# Patient Record
Sex: Female | Born: 1960 | Race: White | Hispanic: No | Marital: Married | Smoking: Never smoker
Health system: Southern US, Community
[De-identification: ages and names within clinical notes are randomized; demographics above are authoritative.]

## PROBLEM LIST (undated history)

## (undated) DIAGNOSIS — E079 Disorder of thyroid, unspecified: Secondary | ICD-10-CM

## (undated) DIAGNOSIS — I7300 Raynaud's syndrome without gangrene: Secondary | ICD-10-CM

## (undated) DIAGNOSIS — D649 Anemia, unspecified: Secondary | ICD-10-CM

## (undated) DIAGNOSIS — R05 Cough: Secondary | ICD-10-CM

## (undated) DIAGNOSIS — K3189 Other diseases of stomach and duodenum: Secondary | ICD-10-CM

## (undated) DIAGNOSIS — R319 Hematuria, unspecified: Secondary | ICD-10-CM

## (undated) DIAGNOSIS — R059 Cough, unspecified: Secondary | ICD-10-CM

## (undated) HISTORY — DX: Hematuria, unspecified: R31.9

## (undated) HISTORY — DX: Anemia, unspecified: D64.9

## (undated) HISTORY — DX: Other diseases of stomach and duodenum: K31.89

## (undated) HISTORY — DX: Raynaud's syndrome without gangrene: I73.00

## (undated) HISTORY — PX: LEEP: SHX91

## (undated) HISTORY — DX: Cough: R05

## (undated) HISTORY — DX: Disorder of thyroid, unspecified: E07.9

## (undated) HISTORY — DX: Cough, unspecified: R05.9

---

## 1998-01-21 ENCOUNTER — Ambulatory Visit (HOSPITAL_COMMUNITY): Admission: RE | Admit: 1998-01-21 | Discharge: 1998-01-21 | Payer: Self-pay | Admitting: Obstetrics and Gynecology

## 1998-09-21 ENCOUNTER — Other Ambulatory Visit: Admission: RE | Admit: 1998-09-21 | Discharge: 1998-09-21 | Payer: Self-pay | Admitting: Obstetrics and Gynecology

## 1998-12-18 ENCOUNTER — Ambulatory Visit (HOSPITAL_COMMUNITY): Admission: RE | Admit: 1998-12-18 | Discharge: 1998-12-18 | Payer: Self-pay | Admitting: Internal Medicine

## 1998-12-18 ENCOUNTER — Encounter: Payer: Self-pay | Admitting: Internal Medicine

## 1999-09-10 ENCOUNTER — Ambulatory Visit (HOSPITAL_COMMUNITY): Admission: RE | Admit: 1999-09-10 | Discharge: 1999-09-10 | Payer: Self-pay | Admitting: Internal Medicine

## 1999-09-10 ENCOUNTER — Encounter: Payer: Self-pay | Admitting: Internal Medicine

## 1999-11-22 ENCOUNTER — Other Ambulatory Visit: Admission: RE | Admit: 1999-11-22 | Discharge: 1999-11-22 | Payer: Self-pay | Admitting: Obstetrics and Gynecology

## 2000-07-31 ENCOUNTER — Encounter: Admission: RE | Admit: 2000-07-31 | Discharge: 2000-07-31 | Payer: Self-pay | Admitting: *Deleted

## 2000-07-31 ENCOUNTER — Encounter: Payer: Self-pay | Admitting: *Deleted

## 2000-12-01 ENCOUNTER — Other Ambulatory Visit: Admission: RE | Admit: 2000-12-01 | Discharge: 2000-12-01 | Payer: Self-pay | Admitting: Obstetrics and Gynecology

## 2001-11-21 ENCOUNTER — Encounter: Payer: Self-pay | Admitting: Family Medicine

## 2001-11-21 ENCOUNTER — Encounter: Admission: RE | Admit: 2001-11-21 | Discharge: 2001-11-21 | Payer: Self-pay | Admitting: Family Medicine

## 2001-12-03 ENCOUNTER — Other Ambulatory Visit: Admission: RE | Admit: 2001-12-03 | Discharge: 2001-12-03 | Payer: Self-pay | Admitting: Obstetrics and Gynecology

## 2001-12-06 ENCOUNTER — Encounter: Payer: Self-pay | Admitting: Obstetrics and Gynecology

## 2001-12-06 ENCOUNTER — Encounter: Admission: RE | Admit: 2001-12-06 | Discharge: 2001-12-06 | Payer: Self-pay | Admitting: Obstetrics and Gynecology

## 2002-09-26 ENCOUNTER — Ambulatory Visit (HOSPITAL_COMMUNITY): Admission: RE | Admit: 2002-09-26 | Discharge: 2002-09-26 | Payer: Self-pay | Admitting: Internal Medicine

## 2002-12-03 ENCOUNTER — Other Ambulatory Visit: Admission: RE | Admit: 2002-12-03 | Discharge: 2002-12-03 | Payer: Self-pay | Admitting: Obstetrics and Gynecology

## 2003-01-06 ENCOUNTER — Encounter: Admission: RE | Admit: 2003-01-06 | Discharge: 2003-01-06 | Payer: Self-pay | Admitting: Obstetrics and Gynecology

## 2003-01-06 ENCOUNTER — Encounter: Payer: Self-pay | Admitting: Obstetrics and Gynecology

## 2003-12-04 ENCOUNTER — Other Ambulatory Visit: Admission: RE | Admit: 2003-12-04 | Discharge: 2003-12-04 | Payer: Self-pay | Admitting: Obstetrics and Gynecology

## 2004-09-01 ENCOUNTER — Encounter: Admission: RE | Admit: 2004-09-01 | Discharge: 2004-09-01 | Payer: Self-pay | Admitting: Family Medicine

## 2004-12-28 ENCOUNTER — Encounter: Admission: RE | Admit: 2004-12-28 | Discharge: 2004-12-28 | Payer: Self-pay | Admitting: Family Medicine

## 2005-01-27 ENCOUNTER — Encounter: Admission: RE | Admit: 2005-01-27 | Discharge: 2005-01-27 | Payer: Self-pay | Admitting: Neurology

## 2005-02-18 ENCOUNTER — Other Ambulatory Visit: Admission: RE | Admit: 2005-02-18 | Discharge: 2005-02-18 | Payer: Self-pay | Admitting: Obstetrics and Gynecology

## 2005-03-18 ENCOUNTER — Encounter: Admission: RE | Admit: 2005-03-18 | Discharge: 2005-03-18 | Payer: Self-pay | Admitting: Obstetrics and Gynecology

## 2005-04-18 ENCOUNTER — Emergency Department (HOSPITAL_COMMUNITY): Admission: EM | Admit: 2005-04-18 | Discharge: 2005-04-18 | Payer: Self-pay | Admitting: Emergency Medicine

## 2006-02-22 ENCOUNTER — Other Ambulatory Visit: Admission: RE | Admit: 2006-02-22 | Discharge: 2006-02-22 | Payer: Self-pay | Admitting: Obstetrics and Gynecology

## 2007-09-04 ENCOUNTER — Other Ambulatory Visit: Admission: RE | Admit: 2007-09-04 | Discharge: 2007-09-04 | Payer: Self-pay | Admitting: Obstetrics and Gynecology

## 2007-09-07 ENCOUNTER — Encounter: Admission: RE | Admit: 2007-09-07 | Discharge: 2007-09-07 | Payer: Self-pay | Admitting: Obstetrics and Gynecology

## 2008-09-12 ENCOUNTER — Other Ambulatory Visit: Admission: RE | Admit: 2008-09-12 | Discharge: 2008-09-12 | Payer: Self-pay | Admitting: Obstetrics and Gynecology

## 2009-09-24 LAB — HM PAP SMEAR

## 2010-05-30 ENCOUNTER — Encounter: Payer: Self-pay | Admitting: Family Medicine

## 2011-03-17 ENCOUNTER — Institutional Professional Consult (permissible substitution): Payer: Self-pay | Admitting: Internal Medicine

## 2011-03-30 ENCOUNTER — Ambulatory Visit: Payer: Self-pay | Admitting: Internal Medicine

## 2011-04-05 ENCOUNTER — Encounter: Payer: Self-pay | Admitting: Internal Medicine

## 2011-04-06 ENCOUNTER — Institutional Professional Consult (permissible substitution): Payer: Self-pay | Admitting: Internal Medicine

## 2011-04-06 ENCOUNTER — Encounter: Payer: Self-pay | Admitting: Internal Medicine

## 2011-04-06 ENCOUNTER — Ambulatory Visit (INDEPENDENT_AMBULATORY_CARE_PROVIDER_SITE_OTHER): Payer: BC Managed Care – PPO | Admitting: Internal Medicine

## 2011-04-06 VITALS — BP 100/62 | HR 62 | Temp 98.3°F | Ht 67.0 in | Wt 126.8 lb

## 2011-04-06 DIAGNOSIS — R059 Cough, unspecified: Secondary | ICD-10-CM | POA: Insufficient documentation

## 2011-04-06 DIAGNOSIS — R918 Other nonspecific abnormal finding of lung field: Secondary | ICD-10-CM

## 2011-04-06 DIAGNOSIS — R05 Cough: Secondary | ICD-10-CM | POA: Insufficient documentation

## 2011-04-06 DIAGNOSIS — R9389 Abnormal findings on diagnostic imaging of other specified body structures: Secondary | ICD-10-CM

## 2011-04-06 NOTE — Progress Notes (Signed)
  Subjective:    Patient ID: Natasha Stafford, female    DOB: [birth year], 49 y.o.   MRN: [id number]  HPI  65 yowf never smoker with no h/o asthma/ allergy to her knowledge with new cough since around 2002 prev w/u in pulm Sherene Sires)  With neg methacholine challenge and ? By ENT with neg response to PPi's though not clear she took them long enough to evaluate response referred back to pulmonary clinic 04/06/11 by Dr Selena Batten for persistent cough and abnormal cxr   04/06/2011  First pulmonary eval in EMR era with cough a bit worse x sev years, mostly dry day > night and  In am after stirring x sev minutes,  What mucus does come up is scant and mucoid with no assoc itching sneezing wheezing overt sinus or HB symtoms.  Sleeping ok without nocturnal  or early am exacerbation  of respiratory  c/o's  Also denies any obvious fluctuation of symptoms with weather or environmental changes or other aggravating or alleviating factors except as outlined above     Review of Systems  Constitutional: Negative for fever and unexpected weight change.  HENT: Negative for ear pain, nosebleeds, congestion, sore throat, rhinorrhea, sneezing, trouble swallowing, dental problem, postnasal drip and sinus pressure.   Eyes: Negative for redness and itching.  Respiratory: Positive for cough. Negative for chest tightness, shortness of breath and wheezing.   Cardiovascular: Positive for palpitations. Negative for leg swelling.  Gastrointestinal: Negative for nausea and vomiting.  Genitourinary: Negative for dysuria.  Musculoskeletal: Negative for joint swelling.  Skin: Negative for rash.  Neurological: Negative for headaches.  Hematological: Does not bruise/bleed easily.  Psychiatric/Behavioral: Negative for dysphoric mood. The patient is not nervous/anxious.        Objective:   Physical Exam  amb wf with peculiar affect who failed to answer a single question asked in a straightforward manner, tending to go off on tangents or  answer questions with ambiguous medical terms or diagnoses and seemed somewhat perplexed when asked the same question more than once for clarification.  freq throat clearing  HEENT: nl dentition, turbinates, and orophanx. Nl external ear canals without cough reflex   NECK :  without JVD/Nodes/TM/ nl carotid upstrokes bilaterally   LUNGS: no acc muscle use, clear to A and P bilaterally without cough on insp or exp maneuvers   CV:  RRR  no s3 or murmur or increase in P2, no edema   ABD:  soft and nontender with nl excursion in the supine position. No bruits or organomegaly, bowel sounds nl  MS:  warm without deformities, calf tenderness, cyanosis or clubbing  SKIN: warm and dry without lesions    NEURO:  alert, approp, no deficits      Assessment & Plan:

## 2011-04-06 NOTE — Patient Instructions (Signed)
Try prilosec 20mg   Take 30-60 min before first meal of the day and Pepcid 20 mg one bedtime until return  GERD (REFLUX)  is an extremely common cause of respiratory symptoms, many times with no significant heartburn at all.    It can be treated with medication, but also with lifestyle changes including avoidance of late meals, excessive alcohol, smoking cessation, and avoid fatty foods, chocolate, peppermint, colas, red wine, and acidic juices such as orange juice.  NO MINT OR MENTHOL PRODUCTS SO NO COUGH DROPS  USE SUGARLESS CANDY INSTEAD (jolley ranchers or Stover's)  NO OIL BASED VITAMINS - use powdered substitutes.    Please schedule a follow up office visit in 6 weeks, call sooner if needed

## 2011-04-07 DIAGNOSIS — R9389 Abnormal findings on diagnostic imaging of other specified body structures: Secondary | ICD-10-CM | POA: Insufficient documentation

## 2011-04-07 NOTE — Assessment & Plan Note (Addendum)
Marking are slt prominent raising the issue of ? Underlying bronchiectasis or MAI - next step in w/u is repeat cxr vs HRCT- placed in tickle file for recall at 6 weeks

## 2011-04-07 NOTE — Assessment & Plan Note (Signed)
The most common causes of chronic cough in immunocompetent adults include the following: upper airway cough syndrome (UACS), previously referred to as postnasal drip syndrome (PNDS), which is caused by variety of rhinosinus conditions; (2) asthma; (3) GERD; (4) chronic bronchitis from cigarette smoking or other inhaled environmental irritants; (5) nonasthmatic eosinophilic bronchitis; and (6) bronchiectasis.   These conditions, singly or in combination, have accounted for up to 94% of the causes of chronic cough in prospective studies.   Other conditions have constituted no >6% of the causes in prospective studies These have included bronchogenic carcinoma, chronic interstitial pneumonia, sarcoidosis, left ventricular failure, ACEI-induced cough, and aspiration from a condition associated with pharyngeal dysfunction.   This has all the features of  Classic Upper airway cough syndrome, so named because it's frequently impossible to sort out how much is  CR/sinusitis with freq throat clearing (which can be related to primary GERD)   vs  causing  secondary (" extra esophageal")  GERD from wide swings in gastric pressure that occur with throat clearing, often  promoting self use of mint and menthol lozenges that reduce the lower esophageal sphincter tone and exacerbate the problem further in a cyclical fashion.   These are the same pts who not infrequently have failed to tolerate ace inhibitors,  dry powder inhalers or biphosphonates or report having reflux symptoms that don't respond to standard doses of PPI , and are easily confused as having aecopd or asthma flares.  The standardized cough guidelines recently published in Chest by [place] in 2006  are a multiple step process (up to 12!) , not a single office visit,  and are intended  to address this problem logically,  with an alogrithm dependent on response to empiric treatment at  each progressive step  to determine a specific diagnosis with   minimal addtional testing needed. Therefore if compliance is an issue or can't be accurately verified then it's very unlikely the standard evaluation and treatment will be successful here.    Furthermore, response to therapy (other than acute cough suppression, which should only be used short term with avoidance of narcotic containing cough syrups if possible), can be a gradual process for which the patient may not receive immediate benefit.  Unlike going to an eye doctor where the right rx is almost always the first one and is immediately effective, this is almost never the case in the management of chronic cough syndromes and the patient needs to commit up front to compliance with recommendations and have the patience to wait out a response for up to 6 weeks of therapy directed at the likely underlying problem(s).    For now rec start the w/u with max acid reflux suppression and diet and f/u here in 6 weeks with next step sinus ct unless seeing ENT actively (this was the same recommendation made on her last evaluation here in 2004 but did not apparently follow through on the recs

## 2011-04-29 ENCOUNTER — Telehealth: Payer: Self-pay | Admitting: Internal Medicine

## 2011-04-29 NOTE — Telephone Encounter (Signed)
Spoke to pt and she states she would like to try zegerid in the place of nexium if this would be ok. She will call back on Monday to see if this is ok with dr wert

## 2011-04-29 NOTE — Telephone Encounter (Signed)
Ok but the dose is Zergerid 40 as the equivalent

## 2011-04-29 NOTE — Telephone Encounter (Signed)
lmomtcb  

## 2011-04-29 NOTE — Telephone Encounter (Signed)
[email or web address].Natasha Stafford, CMA  

## 2011-05-02 NOTE — Telephone Encounter (Signed)
lmomtcb at home #

## 2011-05-04 NOTE — Telephone Encounter (Signed)
Pt aware that she will need to take Zegerid 40mg  daily for equivalent of Nexium. She is going to try the samples of Nexium firtsy and if this is not working she will try Zegerid OTC. Pt aware she will need 40mg  of the Zegerid if she switches to that medication.

## 2011-05-05 ENCOUNTER — Encounter: Payer: Self-pay | Admitting: Internal Medicine

## 2011-05-05 ENCOUNTER — Ambulatory Visit (INDEPENDENT_AMBULATORY_CARE_PROVIDER_SITE_OTHER): Payer: BC Managed Care – PPO | Admitting: Internal Medicine

## 2011-05-05 VITALS — BP 100/60 | HR 79 | Temp 97.5°F | Resp 12 | Ht 67.75 in | Wt 125.0 lb

## 2011-05-05 DIAGNOSIS — N926 Irregular menstruation, unspecified: Secondary | ICD-10-CM

## 2011-05-05 DIAGNOSIS — D649 Anemia, unspecified: Secondary | ICD-10-CM | POA: Insufficient documentation

## 2011-05-05 DIAGNOSIS — E559 Vitamin D deficiency, unspecified: Secondary | ICD-10-CM | POA: Insufficient documentation

## 2011-05-05 DIAGNOSIS — N951 Menopausal and female climacteric states: Secondary | ICD-10-CM

## 2011-05-05 NOTE — Patient Instructions (Signed)
Make a follow up appt with dr. Tresa Res  Continue with primary Md

## 2011-05-05 NOTE — Progress Notes (Signed)
Subjective:    Patient ID: Natasha Stafford, female    DOB: 03/20/1961, 50 y.o.   MRN: [id number]  HPI New pt here for first visit.  She is here to discuss hormone therapy and wishes to keep her primary Md which is Dr. Eduardo Osier Dr. Tresa Res  She is not having vasomotor flushes and has missed her menses in the last 3 months.  She has seen Dr. Tresa Res who wished to place her on Provera withdrawal but pt was hesitant to take this.  She has not had an ultrasound.  No FH or personal history of DVT, PE, blood clotting disorder, uterine or ovarian CA.  No family or personal history of breast cancer.  Paternal GF had heart disease.  She does not report any vaginal dryness or painful intercourse.  Occasionally has trouble sleeping at night.  She has read Rosalita Chessman sommers book and basically wants more information.  She has not had a mammogram in several years      No Known Allergies Past Medical History  Diagnosis Date  . Cough   . Gastric mass     right jugulo   . Emphysema   . Vitamin D deficiency    Past Surgical History  Procedure Date  . Leep    History   Social History  . Marital Status: Married    Spouse Name: N/A    Number of Children: N/A  . Years of Education: N/A   Occupational History  . none    Social History Main Topics  . Smoking status: Never Smoker   . Smokeless tobacco: Never Used  . Alcohol Use: No  . Drug Use: No  . Sexually Active: Yes    Birth Control/ Protection: Condom   Other Topics Concern  . Not on file   Social History Narrative  . No narrative on file   Family History  Problem Relation Age of Onset  . Asthma Brother   . Leukemia Mother    Patient Active Problem List  Diagnoses  . Cough  . Abnormal CXR   Current Outpatient Prescriptions on File Prior to Visit  Medication Sig Dispense Refill  . Ascorbic Acid (VITAMIN C PO) Take 1 tablet by mouth daily.       . Ferrous Sulfate (IRON) 325 (65 FE) MG TABS Take by mouth.        . Multiple  Vitamin (MULTI-VITAMIN DAILY PO) Take by mouth.             Review of Systems    see HPI  No chest pain no SOb no Le edema.  No Gi  Or Gu symptoms Objective:   Physical Exam   Physical Exam  Nursing note and vitals reviewed.  Constitutional: She is oriented to person, place, and time. She appears well-developed and well-nourished.  HENT:  Head: Normocephalic and atraumatic.  Cardiovascular: Normal rate and regular rhythm. Exam reveals no gallop and no friction rub.  No murmur heard.  Pulmonary/Chest: Breath sounds normal. She has no wheezes. She has no rales.  Neurological: She is alert and oriented to person, place, and time.  Skin: Skin is warm and dry.  Psychiatric: She has a normal mood and affect. Her behavior is normal.        Assessment & Plan:  1)  Perimenopause.  Starting to have menstrual irregularities and minimal other symptoms.  Pt given educational handout, encouraged to get her mammogram yearly.  She is still hesitant about this.  I informed  she should never take long term hormones without a baseline mammogram. 2)  Menstrual irregulatrities. I encouraged her to see Dr. Tresa Res and further discuss her recommendations.  She may need an ultrasound at some point but will leave this up to her GYN as she only wishes to discuss hormones with me.  Pt voices that she will call GYN office for appt.  I spent 45 minutes with this pt

## 2011-05-11 ENCOUNTER — Telehealth: Payer: Self-pay | Admitting: *Deleted

## 2011-05-11 NOTE — Telephone Encounter (Signed)
Message copied by Christen Butter on Wed May 11, 2011  5:42 PM ------      Message from: Christen Butter      Created: Tue Apr 12, 2011 11:25 AM       Needs f/u cxr by 06/01/11

## 2011-05-11 NOTE — Telephone Encounter (Signed)
Pt due for ov with cxr by 06/01/11- LMTCB

## 2011-05-19 NOTE — Telephone Encounter (Signed)
Dr. Sherene Sires, I called and spoke with pt and advised needs to come in for rov with month. She verbalized understanding, but states that she has had some GI issues, and never started "program" we put her on- I believe that she is referring to GERD diet and PPI use. She states that she is going to start this next wk and then will call us back in approx 3 wks to schedule her followup. Is this okay? Please advise and if not I will call her back. Thanks!

## 2011-05-19 NOTE — Telephone Encounter (Signed)
That's fine, I put her back in tickle file to call again in 6 weeks

## 2011-07-12 ENCOUNTER — Telehealth: Payer: Self-pay | Admitting: Internal Medicine

## 2011-07-12 NOTE — Telephone Encounter (Signed)
PT informed that no samples of Nexium available at this time.  Pt does not have prescription insurance at this time so she is going to try Prilosec OTC which Dr Sherene Sires had suggested at [facility] in November 2012.  IF this doesn't work she is going to check with pharmacy on price of Nexium Rx and get back with [place].  Pt informed that she is due for ov with cxr with Dr Sherene Sires.  Pt reports that she wants to try the Prilosec first so she weill have something to go by when seeing Dr Sherene Sires.

## 2011-07-13 ENCOUNTER — Telehealth: Payer: Self-pay | Admitting: Internal Medicine

## 2011-07-13 NOTE — Telephone Encounter (Signed)
Set an appt w/ MW w/ CXR on 08/23/11 @ 12:00 p.m.  Pt wanted to let MW now she did not start the "nexium program" until 06/13/11 & then about 1 wk ago she started a different medication.  Pt wanted to be on this for a while prior to her ROV & then will be out on vacation.  Natasha Stafford

## 2011-07-13 NOTE — Telephone Encounter (Signed)
Pt due this month for rov with cxr- nothing scheduled yet so call her to make an appt and had to [facility].

## 2011-08-23 ENCOUNTER — Ambulatory Visit: Payer: BC Managed Care – PPO | Admitting: Internal Medicine

## 2012-07-26 ENCOUNTER — Telehealth: Payer: Self-pay | Admitting: Obstetrics and Gynecology

## 2012-07-26 NOTE — Telephone Encounter (Signed)
Request to speak to nurse about meds

## 2012-07-27 NOTE — Telephone Encounter (Signed)
Left msg to return call to office. sue

## 2012-07-27 NOTE — Telephone Encounter (Signed)
Patient notified of Dr. Tresa Res response after reviewing patient chart. Patient does want to get appt. For HRT consult with Dr. Tresa Res. Transferred call to scheduling.

## 2012-07-27 NOTE — Telephone Encounter (Signed)
Patient called was hesitant to start the prometrium medication as prescribed at last visit due to side effects she had expierienced with other meds. States did take the Prometrium . Last dose done on 2./ 27/2014. Has not had a cycl. Do you want her to continue prometrium? Also patient asking do we do Bio Identical Hormone Replacement here in office?. Please advise. sue

## 2012-07-27 NOTE — Telephone Encounter (Signed)
Natasha Stafford - I will need her paper chart to answer this question.  Thanks.

## 2012-07-27 NOTE — Telephone Encounter (Signed)
She was supposed to take it back in October when I saw her and she hadn't had a period for 6 months.  Now that is has been almost a year, I think it is ok to not take it.  And yes, we do bio-identical HRT here.  If she is interested in discussing HRT, please come in for a consultation.

## 2012-07-27 NOTE — Telephone Encounter (Signed)
Sorry, chart in your cabinet, sue

## 2012-10-10 ENCOUNTER — Telehealth: Payer: Self-pay | Admitting: Obstetrics and Gynecology

## 2012-10-10 NOTE — Telephone Encounter (Signed)
Would like to find out if Dr. Tresa Res Bio Identical [facility] Treatment.

## 2012-10-10 NOTE — Telephone Encounter (Signed)
LMTCB  Route to triage

## 2012-10-12 NOTE — Telephone Encounter (Signed)
Patient interested in Bio-identical testing and treatment of menopausal symptoms and wants to know if CR can do this. Advised that we do not perform this testing and I recommend she have consult visit with CR to discuss her symptoms and the pros and cons of bio-identical testing.  Also advised of CR pending retirement, patient requests to move up aex, (has new insurance) and see Cr one more time and will ask her about this then. AEX sched for 11-23-2012.

## 2012-10-26 ENCOUNTER — Telehealth: Payer: Self-pay | Admitting: Obstetrics and Gynecology

## 2012-10-26 ENCOUNTER — Encounter: Payer: Self-pay | Admitting: Gynecology

## 2012-10-26 ENCOUNTER — Ambulatory Visit (INDEPENDENT_AMBULATORY_CARE_PROVIDER_SITE_OTHER): Payer: BC Managed Care – PPO | Admitting: Gynecology

## 2012-10-26 VITALS — BP 120/76 | HR 64 | Resp 12 | Ht 67.75 in | Wt 126.0 lb

## 2012-10-26 DIAGNOSIS — N898 Other specified noninflammatory disorders of vagina: Secondary | ICD-10-CM

## 2012-10-26 MED ORDER — FLUCONAZOLE 150 MG PO TABS: 150.0000 mg | ORAL_TABLET | Freq: Once | ORAL | Status: DC

## 2012-10-26 NOTE — Telephone Encounter (Signed)
LMTCB about appt  aa 

## 2012-10-26 NOTE — Progress Notes (Signed)
52 y.o.MarriedCaucasian female G2P2 with a 1 month(s) history of the following:discharge described as yellow, thin no odor and vulvar itching Sexually active: yes Last sexual activity:7days ago.  DId not make discharge worse.  Pt also reports the following associated symptoms: none.  LMP 08/2101, no postmenopausal bleeding     Exam:  ZOX:[id number]                WUJ:[id number]: scant, white and thin, pH 4.5, wet prep done, yes                Cx:  normal appearance                Uterus:normal size, anteverted, non-tender                Adnexa: normal adnexa  Wet Prep shows:few yeast   NF:AOZHYQM vaginitis   VH:QION antiviral see orders.

## 2012-10-26 NOTE — Telephone Encounter (Signed)
Pt has a yeast infection and offered appt with dr lathrop at 12:15. Can't come in at that time. Wanting something later in the afternoon.

## 2012-10-26 NOTE — Telephone Encounter (Signed)
Spoke with pt about an appt today. Pt is on a golf [place] with her [place] and is not sure she could make it by 1:15 to see TL. Pt will know in an hour how long she will be and will call back to cancel if she can't make it. Sched OV at 1:15 with TL. Advised on OTC remedies if she cannot make it until Monday.

## 2012-10-29 ENCOUNTER — Ambulatory Visit: Payer: Self-pay | Admitting: Gynecology

## 2012-11-23 ENCOUNTER — Ambulatory Visit: Payer: Self-pay | Admitting: Obstetrics and Gynecology

## 2012-12-05 ENCOUNTER — Institutional Professional Consult (permissible substitution): Payer: Self-pay | Admitting: Obstetrics and Gynecology

## 2012-12-10 ENCOUNTER — Encounter: Payer: Self-pay | Admitting: Obstetrics and Gynecology

## 2012-12-11 ENCOUNTER — Ambulatory Visit (INDEPENDENT_AMBULATORY_CARE_PROVIDER_SITE_OTHER): Payer: BC Managed Care – PPO | Admitting: Obstetrics and Gynecology

## 2012-12-11 ENCOUNTER — Encounter: Payer: Self-pay | Admitting: Obstetrics and Gynecology

## 2012-12-11 VITALS — BP 100/60 | HR 64 | Resp 18 | Ht 67.0 in | Wt 124.0 lb

## 2012-12-11 DIAGNOSIS — Z01419 Encounter for gynecological examination (general) (routine) without abnormal findings: Secondary | ICD-10-CM

## 2012-12-11 DIAGNOSIS — N951 Menopausal and female climacteric states: Secondary | ICD-10-CM

## 2012-12-11 DIAGNOSIS — Z0000 Encounter for general adult medical examination without abnormal findings: Secondary | ICD-10-CM

## 2012-12-11 LAB — POCT URINALYSIS DIPSTICK
Bilirubin, UA: NEGATIVE
Glucose, UA: NEGATIVE
Nitrite, UA: NEGATIVE
Urobilinogen, UA: NEGATIVE

## 2012-12-11 MED ORDER — PROGESTERONE MICRONIZED 100 MG PO CAPS: 100.0000 mg | ORAL_CAPSULE | Freq: Every day | ORAL | Status: DC

## 2012-12-11 MED ORDER — ESTRADIOL 0.05 MG/24HR TD PTTW: 1.0000 | MEDICATED_PATCH | TRANSDERMAL | Status: DC

## 2012-12-11 NOTE — Patient Instructions (Signed)

## 2012-12-11 NOTE — Progress Notes (Addendum)
52 y.o.   Married    Caucasian   female   [phone]   here for annual exam.  Recent vag itching resolved with tx after last visit.  Has been having some darker discharge than usual, but is now getting better.  Never looked brown or bloody.  Pt wishes to consider HRT.  She states she has done a lot of research and wishes to consider the HRT for bone and vulvar health and even thinks she sees little reason not to stay on it for the long term.  Wants "natural" E2 and P4.  Patient's last menstrual period was 09/05/2011.          Sexually active: yes  The current method of family planning is condoms most of the time.    Exercising: no Last mammogram:  09/07/07 neg last mm hurt so much she never went back - will consider - she had intermittant pain for over a year after her last MM Last pap smear: 5/18 11 neg History of abnormal pap: 15-20 years ago (LEEP) Smoking: no Alcohol: occ beer Last colonoscopy:never  ecouraged to go.  Pt will consider.   Last Bone Density: never  Last tetanus shot: 2000 Last cholesterol check: 11/26/10 total 222  Hgb:    11.5            Urine: neg   Family History  Problem Relation Age of Onset  . Asthma Brother   . Leukemia Mother     Patient Active Problem List   Diagnosis Date Noted  . Vitamin d deficiency   . Anemia   . Abnormal CXR 04/07/2011  . Cough 04/06/2011    Past Medical History  Diagnosis Date  . Cough   . 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 Emphysema   . Vitamin D deficiency   . Anemia   . Raynaud disease   . Hematuria     Past Surgical History  Procedure Laterality Date  . Leep      Allergies: Review of patient's allergies indicates no known allergies.  Current Outpatient Prescriptions  Medication Sig Dispense Refill  . Ascorbic Acid (VITAMIN C PO) Take 1 tablet by mouth daily.       Marland Kitchen BIOTIN PO Take by mouth daily.      . Cholecalciferol (VITAMIN D) 2000 UNITS tablet Take 2,000 Units by mouth daily.        .  Ferrous Sulfate (IRON) 325 (65 FE) MG TABS Take by mouth every 30 (thirty) days.       . Multiple Vitamin (MULTI-VITAMIN DAILY PO) Take by mouth once a week.        No current facility-administered medications for this visit.    ROS: Pertinent items are noted in HPI.  Social Hx:  Married, two children, not working  Exam:    BP 100/60  Pulse 64  Resp 18  Ht 5\' 7"  (1.702 m)  Wt 124 lb (56.246 kg)  BMI 19.42 kg/m2  LMP 09/05/2011  Ht and wt stable Wt Readings from Last 3 Encounters:  12/11/12 124 lb (56.246 kg)  10/26/12 126 lb (57.153 kg)  05/05/11 125 lb (56.7 kg)     Ht Readings from Last 3 Encounters:  12/11/12 5\' 7"  (1.702 m)  10/26/12 5' 7.75" (1.721 m)  05/05/11 5' 7.75" (1.721 m)    General appearance: alert, cooperative and appears stated age Head: Normocephalic, without obvious abnormality, atraumatic Neck: no adenopathy, supple, symmetrical, trachea midline  and thyroid not enlarged, symmetric, no tenderness/mass/nodules Lungs: clear to auscultation bilaterally Breasts: Inspection negative, No nipple retraction or dimpling, No nipple discharge or bleeding, No axillary or supraclavicular adenopathy, Normal to palpation without dominant masses Heart: regular rate and rhythm Abdomen: soft, non-tender; bowel sounds normal; no masses,  no organomegaly Extremities: extremities normal, atraumatic, no cyanosis or edema Skin: Skin color, texture, turgor normal. No rashes or lesions Lymph nodes: Cervical, supraclavicular, and axillary nodes normal. No abnormal inguinal nodes palpated Neurologic: Grossly normal   Pelvic: External genitalia:  no lesions              Urethra:  normal appearing urethra with no masses, tenderness or lesions              Bartholins and Skenes: normal                 Vagina: normal appearing vagina with normal color and discharge, no lesions              Cervix: normal appearance              Pap taken: yes        Bimanual Exam:  Uterus:   uterus is normal size, shape, consistency and nontender                                      Adnexa: normal adnexa in size, nontender and no masses                                      Rectovaginal: Confirms                                      Anus:  normal sphincter tone, no lesions  A: normal perimenopausal exam     H/o micoscopic hematuria , none today     Desires HRT     P:     mammogram pap smear counseled on breast self exam, mammography screening, adequate intake of calcium and vitamin D, diet and exercise return annually or prn  Very long discussion with this well informed patient over benefts and risks of HRT, how long to use it, routes of administration.  Questions answered.  She and I decide to have her use Minivelle 0.05 mg and P4 100 mg qhs.  She states she understands that there may be an increased risk of breast/ovarian cancer in users, and she accepts these risks.  I have encouraged her to get a MM now and again qyr while on HRT, and she states she will comply.  She has previously been very reluctant d/t how much and how long she hurt after her last MM.  3D MM recommended.      An After Visit Summary was printed and given to the patient.  12/13/2012  Pap came back ASCUS neg HPV.  CPRomine MD

## 2012-12-13 LAB — IPS PAP TEST WITH HPV

## 2012-12-14 ENCOUNTER — Other Ambulatory Visit: Payer: Self-pay

## 2012-12-14 DIAGNOSIS — Z1231 Encounter for screening mammogram for malignant neoplasm of breast: Secondary | ICD-10-CM

## 2012-12-31 ENCOUNTER — Ambulatory Visit
Admission: RE | Admit: 2012-12-31 | Discharge: 2012-12-31 | Disposition: A | Discharge status: Home / Self Care | Payer: BC Managed Care – PPO | Source: Ambulatory Visit

## 2012-12-31 DIAGNOSIS — Z1231 Encounter for screening mammogram for malignant neoplasm of breast: Secondary | ICD-10-CM

## 2013-01-17 ENCOUNTER — Telehealth: Payer: Self-pay | Admitting: Obstetrics and Gynecology

## 2013-01-17 ENCOUNTER — Other Ambulatory Visit: Payer: Self-pay | Admitting: Obstetrics and Gynecology

## 2013-01-17 DIAGNOSIS — E559 Vitamin D deficiency, unspecified: Secondary | ICD-10-CM

## 2013-01-17 NOTE — Telephone Encounter (Signed)
Patient wants results form her visit on 12-11-2012.

## 2013-01-17 NOTE — Telephone Encounter (Signed)
Spoke with pt about results. Pt thought that a Vit D level was going to be drawn that day as well, as she is on Rx Vit D and has a history of deficiency. Pt a little irritated that there is no Vit D result in Epic, and no mention of it in CR's note from that visit. Would you like pt to come have it drawn?

## 2013-01-17 NOTE — Telephone Encounter (Signed)
I would be happy to reassess the vitamin D level for the patient.  I am sorry that this will require an additional blood draw.  I will put in an order in Epic, and patient will need to schedule this lab visit.

## 2013-01-18 NOTE — Telephone Encounter (Signed)
LMTCB about lab appt for Vit D.  aa

## 2013-01-23 ENCOUNTER — Telehealth: Payer: Self-pay | Admitting: Obstetrics and Gynecology

## 2013-01-23 NOTE — Telephone Encounter (Signed)
Patient has a question regarding her last visit.

## 2013-01-24 NOTE — Telephone Encounter (Signed)
Kennon Rounds,  Thank you for assisting in the care of this patient.

## 2013-01-24 NOTE — Telephone Encounter (Signed)
Follow up call to patient.  She expressed concern over misinformation she received from receptionist and hole time.  In adddition, upset that Vitamin D was not done even though it was discussed and is done each year.  Apologized for her experience and offered to do Vit D level at her convenience but patient declines to come back in. Asurred her these issues are being addressed and she could call back to me, clinical supervisor, for any additional concerns.

## 2013-01-24 NOTE — Telephone Encounter (Signed)
Patient call wanting to speak to you about her last 2 visits.

## 2013-01-25 NOTE — Telephone Encounter (Signed)
LMTCB 01/25/13 cm

## 2013-03-01 ENCOUNTER — Ambulatory Visit: Payer: Self-pay | Admitting: Obstetrics and Gynecology

## 2013-03-11 ENCOUNTER — Telehealth: Payer: Self-pay | Admitting: Obstetrics and Gynecology

## 2013-03-11 NOTE — Telephone Encounter (Signed)
Dr. Edward Jolly,  Previous Patient of Dr. Tresa Res.  Requesting to change to topical progesterone and use Minivelle Patch. Please advise.   Chart to your office.

## 2013-03-11 NOTE — Telephone Encounter (Signed)
Patient has a question about her medications that Dr. Tresa Res prescribed. She was prescribed estrodial and progesterone. Patient would like to know if she can get a prescription for topical prometrium instead of the capsule and keep estrodial the same if possible. Patient has not picked up these medication since she has been stressing about this. Please advice.  Call home number first. vm not set up on cell.

## 2013-03-11 NOTE — Telephone Encounter (Signed)
French Ana,  This will need to come from a compounding pharmacy such as [facility] on [address] Can you contact them to get the suggested dosage for this so I can prescribe it?  Thanks!

## 2013-03-11 NOTE — Telephone Encounter (Signed)
Spoke with patient. I advised I would call [facility] to find out dosages and cost for patient on 11/4.

## 2013-03-11 NOTE — Telephone Encounter (Signed)
Natasha Stafford,  For clarification, the progesterone cream is what will need to be compounded. The Minivelle, of course, comes in standard dosages and is not compounded.  Thanks.

## 2013-03-12 NOTE — Telephone Encounter (Signed)
Dr. Edward Jolly, I spoke with [facility]:   They state:   Progesterone can be ordered based on the dosage that patient would need. So if you wanted patient to stay on 100 mg Progesterone daily, Will need to order:   10% Progesterone (100 mg/ml) in HRT Base. Apply 1 ml topically daily. Dispense 90 grams.   90 grams will last 90 days, 30 grams to last 30 days.   Cost for patient will be 37.79/30 days     73.28/90 days  Spoke with patient, she would like a 30 day supply.   I pended the rx for your review and signature.

## 2013-03-13 MED ORDER — NONFORMULARY OR COMPOUNDED ITEM: Status: DC

## 2013-03-22 ENCOUNTER — Telehealth: Payer: Self-pay | Admitting: Obstetrics and Gynecology

## 2013-03-22 NOTE — Telephone Encounter (Signed)
Return call to patient, LMTCB.  

## 2013-03-22 NOTE — Telephone Encounter (Signed)
Patient had mammogram in august and got a letter stating she has dense breast tissue. She didn't know if she needed to have breast ultrasound.

## 2013-03-25 NOTE — Telephone Encounter (Signed)
Patient is calling because she got a letter for [facility] imaging with notation that she had dense breasts. Patient states her last mammogram in August was 3D as reccommended by Dr. Tresa Res.  Patient is holding off on starting hormone therapy because she is worried that the past exam was not sufficient enough to view all of the breast tissue.  Patient is wondering if she can have breast U/S.  I advised that since she had a normal screening mammogram that U/S would not be necessary and 3D should be sufficient.  I advised I would send message to Dr. Edward Jolly to see if she concurs. Patient denies any breast issue, has no complaints at this time.

## 2013-03-25 NOTE — Telephone Encounter (Signed)
I have reviewed patient's 3D mammogram which was read as normal.   There were no suspicious areas requiring any further evaluation.  She will need her regular screening mammogram next year. Random breast ultrasound is not indicated at this time unless there is concern about a specific lump. I do recommend that the patient perform monthly self breast exams. If patient would like to have a visit with me to discuss hormone therapy, I am very happy to review this with her!

## 2013-03-26 NOTE — Telephone Encounter (Signed)
Spoke with patient and message from Dr. Edward Jolly given. Patient verbalized understanding and was happy with Dr. Rica Records response. She will call for any f/u needed prn.

## 2013-09-11 ENCOUNTER — Telehealth: Payer: Self-pay | Admitting: Obstetrics and Gynecology

## 2013-09-11 NOTE — Telephone Encounter (Signed)
Spoke with patient. Advised last tetanus shot was given 09/12/2008 and last cholesterol level that was drawn was in 2012. Patient agreeable and verbalizes understanding.  Routing to provider for final review. Patient agreeable to disposition. Will close encounter.

## 2013-09-11 NOTE — Telephone Encounter (Signed)
Patient wants to know when her last tetnus shot was and when she last had her cholesterol checked

## 2013-09-25 ENCOUNTER — Telehealth: Payer: Self-pay | Admitting: Obstetrics and Gynecology

## 2013-09-25 NOTE — Telephone Encounter (Signed)
Pt states she had her blood drawn last visit and wants to know what was actually was checked. She also wants to know if she needs to have her cholestenol checked now.

## 2013-09-26 NOTE — Telephone Encounter (Signed)
Spoke with patient. She was given information regarding her last lab testing, August of 12/2012, had hemoglobin drawn only. Patient states she feels like more labs were drawn then that. Advised that I can only see results from hgb and apologized for any confusion. Patient is seeing her pcp and wanted to know what labs last tested so as to not repeat. Advised per Dr. Harlene Saltsomine's note last cholesterol was 2012. Patient wanted to know if she would be covered for a routine exam with pcp and advised that she would need to contact her insurance company to verify. Advised that last visit was routine gyn exam. Patient verbalized understanding and will follow up prn.   Routing to provider for final review. Patient agreeable to disposition. Will close encounter

## 2013-12-05 ENCOUNTER — Telehealth: Payer: Self-pay | Admitting: Obstetrics and Gynecology

## 2013-12-05 NOTE — Telephone Encounter (Signed)
Confirming pts appt °

## 2013-12-12 ENCOUNTER — Encounter: Payer: Self-pay | Admitting: Obstetrics and Gynecology

## 2013-12-12 ENCOUNTER — Ambulatory Visit (INDEPENDENT_AMBULATORY_CARE_PROVIDER_SITE_OTHER): Payer: BC Managed Care – PPO | Admitting: Obstetrics and Gynecology

## 2013-12-12 VITALS — BP 108/72 | HR 68 | Ht 67.0 in | Wt 121.0 lb

## 2013-12-12 DIAGNOSIS — Z0000 Encounter for general adult medical examination without abnormal findings: Secondary | ICD-10-CM

## 2013-12-12 DIAGNOSIS — Z01419 Encounter for gynecological examination (general) (routine) without abnormal findings: Secondary | ICD-10-CM

## 2013-12-12 LAB — POCT URINALYSIS DIPSTICK
BILIRUBIN UA: NEGATIVE
Glucose, UA: NEGATIVE
KETONES UA: NEGATIVE
Leukocytes, UA: NEGATIVE
NITRITE UA: NEGATIVE
PH UA: 6
PROTEIN UA: NEGATIVE
Urobilinogen, UA: NEGATIVE

## 2013-12-12 LAB — HEMOGLOBIN, FINGERSTICK: Hemoglobin, fingerstick: 12.2 g/dL (ref 12.0–16.0)

## 2013-12-12 NOTE — Patient Instructions (Signed)

## 2013-12-12 NOTE — Progress Notes (Signed)
GYNECOLOGY VISIT  PCP:  Dr. Pearson GrippeJames Kim  Referring provider:   HPI: 53 y.o.   Married  Caucasian  female   [phone] with Patient's last menstrual period was 09/05/2011.   here for   Annual.  Multiple questions regarding the rationale of screening testing, benefits and risks of HRT and anemia.  No labs available for review today but has questions about them.  Does not want to duplicated labs. Asking about ultrasound for breast cancer screening.   Has a history of low hemoglobin and wants iron checked.   Had labs with Dr. Selena BattenKim and was told it was normal.  Vitamin D was normal.  Taking Vit D and calcium.   Stopped HRT from Dr. Tresa Resomine.  She was having hair loss prior to this.  (Had thyroid function testing with her PCP. ) Micah FlesherWent to another provider to have bioidentical HRT and to measure levels and follow them.  Using Progesterone cream. Just stopped using Minivelle as her estrogen level was high.   Hgb:  12.2 Urine:  Trace blood. Asymptomatic. Longstanding.  Saw Dr. Aldean AstKimbrough and had evaluation and cystoscopy was negative.     GYNECOLOGIC HISTORY: Patient's last menstrual period was 09/05/2011. Sexually active:  yes Partner preference: female Contraception:   none Menopausal hormone therapy: progesterone Cream DES exposure:   no Blood transfusions: no    Sexually transmitted diseases: no GYN procedures and prior surgeries:  LEEP Last mammogram:      01/02/2013 Bi-Rads Neg           Last pap and high risk HPV testing:  12/11/12 ASCUS, negative HR HPV.  History of abnormal pap smear:  Yes 15-20 years ago (LEEP)   OB History   Grav Para Term Preterm Abortions TAB SAB Ect Mult Living   5 2   3 3    2        LIFESTYLE: Exercise:  no             Tobacco: no Alcohol:no Drug use:no    OTHER HEALTH MAINTENANCE: Tetanus/TDap:09/2008 Gardisil:no Influenza: no   Zostavax:no  Bone density: 09/2013 WNL pet pt Colonoscopy:10/23/13, WNL per pt  Cholesterol check: PCP  Family History   Problem Relation Age of Onset  . Asthma Brother   . Leukemia Mother     Patient Active Problem List   Diagnosis Date Noted  . Vitamin d deficiency   . Anemia   . Abnormal CXR 04/07/2011  . Cough 04/06/2011   Past Medical History  Diagnosis Date  . Cough   . 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 Emphysema   . Vitamin D deficiency   . Anemia   . Raynaud disease   . Hematuria     Past Surgical History  Procedure Laterality Date  . Leep      ALLERGIES: Review of patient's allergies indicates no known allergies.  Current Outpatient Prescriptions  Medication Sig Dispense Refill  . Ascorbic Acid (VITAMIN C PO) Take 1 tablet by mouth daily.       . Cholecalciferol (VITAMIN D) 2000 UNITS tablet Take 2,000 Units by mouth daily.        . Multiple Vitamin (MULTI-VITAMIN DAILY PO) Take by mouth once a week.       . NONFORMULARY OR COMPOUNDED ITEM 2.5 mLs daily. 10% (100 mg/ml) Progesterone Cream in HRT Base. Dispense 30 grams for 30 day supply.      Marland Kitchen. BIOTIN PO Take by mouth daily.      .Marland Kitchen  estradiol (MINIVELLE) 0.05 MG/24HR patch Place 1 patch (0.05 mg total) onto the skin 2 (two) times a week. Apply anywhere on lower abdomen.  Change patch on Sun am and Wed pm  8 patch  12  . Ferrous Sulfate (IRON) 325 (65 FE) MG TABS Take by mouth every 30 (thirty) days.        No current facility-administered medications for this visit.     ROS:  Pertinent items are noted in HPI.  SOCIAL HISTORY:  ---  PHYSICAL EXAMINATION:    BP 108/72  Pulse 68  Ht 5\' 7"  (1.702 m)  Wt 121 lb (54.885 kg)  BMI 18.95 kg/m2  LMP 09/05/2011   Wt Readings from Last 3 Encounters:  12/12/13 121 lb (54.885 kg)  12/11/12 124 lb (56.246 kg)  10/26/12 126 lb (57.153 kg)     Ht Readings from Last 3 Encounters:  12/12/13 5\' 7"  (1.702 m)  12/11/12 5\' 7"  (1.702 m)  10/26/12 5' 7.75" (1.721 m)    General appearance: alert, cooperative and appears stated age Head: Normocephalic, without  obvious abnormality, atraumatic Neck: no adenopathy, supple, symmetrical, trachea midline and thyroid not enlarged, symmetric, no tenderness/mass/nodules Lungs: clear to auscultation bilaterally Breasts: Inspection negative, No nipple retraction or dimpling, No nipple discharge or bleeding, No axillary or supraclavicular adenopathy, Normal to palpation without dominant masses Heart: regular rate and rhythm Abdomen: soft, non-tender; no masses,  no organomegaly Extremities: extremities normal, atraumatic, no cyanosis or edema Skin: Skin color, texture, turgor normal. No rashes or lesions Lymph nodes: Cervical, supraclavicular, and axillary nodes normal. No abnormal inguinal nodes palpated Neurologic: Grossly normal  Pelvic: External genitalia:  no lesions              Urethra:  normal appearing urethra with no masses, tenderness or lesions              Bartholins and Skenes: normal                 Vagina: normal appearing vagina with normal color and discharge, no lesions              Cervix: normal appearance              Pap and high risk HPV testing done: Yes.           Bimanual Exam:  Uterus:  uterus is normal size, shape, consistency and nontender                                      Adnexa: normal adnexa in size, nontender and no masses                                      Rectovaginal: Confirms                                      Anus:  normal sphincter tone, no lesions  ASSESSMENT  Normal gynecologic exam. Dense breast tissue.  History of LEEP.  HRT patient.   PLAN  Check Hgb now:  12.2 Mammogram recommended yearly.  May want to add thermography.  Discussed limits of mammogram with dense breast tissue.  Pap smear and high risk HPV testing performed.  Discussed risks and benefits of  HRT.  Patient may choose to continue through her other provider doing bioidentical.  Counseled on self breast exam, Calcium and vitamin D intake, exercise. Return annually or prn   An After  Visit Summary was printed and given to the patient.  25 additional minutes discussing HRT, breast cancer screening, pap smears, blood testing for anemia.

## 2013-12-18 LAB — IPS PAP TEST WITH HPV

## 2013-12-23 ENCOUNTER — Telehealth: Payer: Self-pay

## 2013-12-23 NOTE — Telephone Encounter (Signed)
Called pt with pap smear results and LMOVM [phone] to call me.

## 2013-12-23 NOTE — Telephone Encounter (Signed)
Message copied by Alphonsa OverallIXON, Trenton Passow L on Mon Dec 23, 2013  9:29 AM ------      Message from: AMUNDSON DE Gwenevere GhaziARVALHO E SILVA, BROOK E      Created: Thu Dec 19, 2013 10:08 AM       Please inform patient of ASCUS pap, atypical cells of undetermined significance.       High risk HPV status is negative.       Next pap in three years.      Enter recall - 02 for annual exam. ------

## 2013-12-25 NOTE — Telephone Encounter (Signed)
Pt is calling amanda back. °

## 2013-12-26 ENCOUNTER — Telehealth: Payer: Self-pay

## 2013-12-26 NOTE — Telephone Encounter (Signed)
Patient notified see result note 

## 2013-12-26 NOTE — Telephone Encounter (Signed)
Message copied by Alphonsa OverallIXON, AMANDA L on Thu Dec 26, 2013  1:18 PM ------      Message from: AMUNDSON DE Gwenevere GhaziARVALHO E SILVA, BROOK E      Created: Thu Dec 19, 2013 10:08 AM       Please inform patient of ASCUS pap, atypical cells of undetermined significance.       High risk HPV status is negative.       Next pap in three years.      Enter recall - 02 for annual exam. ------

## 2013-12-26 NOTE — Telephone Encounter (Signed)
Called pt with pap smear results at [phone] and LMOVM to call me.

## 2014-03-10 ENCOUNTER — Encounter: Payer: Self-pay | Admitting: Obstetrics and Gynecology

## 2014-03-28 ENCOUNTER — Telehealth: Payer: Self-pay | Admitting: Obstetrics and Gynecology

## 2014-03-28 NOTE — Telephone Encounter (Signed)
Left message regarding upcoming appointment time change. °

## 2014-09-10 ENCOUNTER — Other Ambulatory Visit: Payer: Self-pay | Admitting: Family Medicine

## 2014-09-10 DIAGNOSIS — E041 Nontoxic single thyroid nodule: Secondary | ICD-10-CM

## 2014-09-11 ENCOUNTER — Ambulatory Visit
Admission: RE | Admit: 2014-09-11 | Discharge: 2014-09-11 | Disposition: A | Discharge status: Home / Self Care | Payer: BLUE CROSS/BLUE SHIELD | Source: Ambulatory Visit | Attending: Family Medicine | Admitting: Family Medicine

## 2014-09-11 DIAGNOSIS — E041 Nontoxic single thyroid nodule: Secondary | ICD-10-CM

## 2014-12-12 ENCOUNTER — Telehealth: Payer: Self-pay | Admitting: Obstetrics and Gynecology

## 2014-12-12 NOTE — Telephone Encounter (Signed)
Patient has an appointment next week for her aex with Dr.Silva. Patient has some questions for her nurse before coming to this appointment. Last seen 12/12/13.

## 2014-12-12 NOTE — Telephone Encounter (Signed)
Spoke with patient. Patient has aex scheduled for 12/19/2014 at 4pm with Dr.Silva. Patient is asking if she will be due for a pap smear at this appointment. Advised per note from Dr.Silva from last years pap next pap will be in 2018. Please see note below. Patient is agreeable and verbalizes understanding. Aware of important of keeping her aex for pelvic and breast exam and to have any medications refilled if needed.  Alphonsa Overall, CMA at 12/26/2013 1:18 PM     Status: Signed       Expand All Collapse All   Message copied by Alphonsa Overall on Thu Dec 26, 2013 1:18 PM ------  Message from: AMUNDSON DE Gwenevere Ghazi, BROOK E  Created: Thu Dec 19, 2013 10:08 AM   Please inform patient of ASCUS pap, atypical cells of undetermined significance.   High risk HPV status is negative.   Next pap in three years.  Enter recall - 02 for annual exam.       Routing to provider for final review. Patient agreeable to disposition. Will close encounter.   Patient aware provider will review message and nurse will return call if any additional advice or change of disposition.

## 2014-12-19 ENCOUNTER — Ambulatory Visit: Payer: BC Managed Care – PPO | Admitting: Obstetrics and Gynecology

## 2015-01-23 ENCOUNTER — Other Ambulatory Visit: Payer: Self-pay

## 2015-01-26 ENCOUNTER — Other Ambulatory Visit: Payer: Self-pay | Admitting: Family Medicine

## 2015-01-26 DIAGNOSIS — E042 Nontoxic multinodular goiter: Secondary | ICD-10-CM

## 2015-01-28 ENCOUNTER — Ambulatory Visit
Admission: RE | Admit: 2015-01-28 | Discharge: 2015-01-28 | Disposition: A | Discharge status: Home / Self Care | Payer: 59 | Source: Ambulatory Visit | Attending: Family Medicine | Admitting: Family Medicine

## 2015-01-28 DIAGNOSIS — E042 Nontoxic multinodular goiter: Secondary | ICD-10-CM

## 2015-02-27 ENCOUNTER — Ambulatory Visit: Payer: Self-pay | Admitting: Obstetrics and Gynecology

## 2015-03-06 ENCOUNTER — Encounter: Payer: Self-pay | Admitting: Obstetrics and Gynecology

## 2015-03-06 ENCOUNTER — Ambulatory Visit (INDEPENDENT_AMBULATORY_CARE_PROVIDER_SITE_OTHER): Payer: 59 | Admitting: Obstetrics and Gynecology

## 2015-03-06 DIAGNOSIS — Z0000 Encounter for general adult medical examination without abnormal findings: Secondary | ICD-10-CM | POA: Diagnosis not present

## 2015-03-06 DIAGNOSIS — Z79890 Hormone replacement therapy (postmenopausal): Secondary | ICD-10-CM

## 2015-03-06 DIAGNOSIS — N852 Hypertrophy of uterus: Secondary | ICD-10-CM

## 2015-03-06 DIAGNOSIS — Z01419 Encounter for gynecological examination (general) (routine) without abnormal findings: Secondary | ICD-10-CM | POA: Diagnosis not present

## 2015-03-06 NOTE — Patient Instructions (Signed)

## 2015-03-06 NOTE — Progress Notes (Signed)
Patient ID: Natasha Stafford, female   DOB: Dec 07, 1960, 54 y.o.   MRN: [id number] 54 y.o. [id number] Married Caucasian female here for annual exam.    Patient is on estriol and progesterone through provider in [employer].   Does labs with provider in [place].  PCP: Pearson Grippe, MD.    Patient's last menstrual period was 09/05/2011.          Sexually active: Yes.   female The current method of family planning is post menopausal status.    Exercising: Yes.    Walking. Smoker:  no  Health Maintenance: Pap:  12-12-13 Ascus:Neg HR HPV History of abnormal Pap:  Yes, Hx LEEP procedure around 1995. MMG:  01-02-13 Density Cat.C/Neg/BiRads1:[facility].  Has had a painful mammogram in the past which was long lasting.  Colonoscopy:  10-23-13 normal per patient BMD:   09/2013  Result  Normal per patient TDaP:  09/2008 Screening Labs:  Hb today: 11.5, Urine today: Neg   reports that she has never smoked. She has never used smokeless tobacco. She reports that she drinks about 0.5 oz of alcohol per week. She reports that she uses illicit drugs.  Past Medical History  Diagnosis Date  . Cough   . 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 Emphysema   . Vitamin D deficiency   . Anemia   . Raynaud disease   . Hematuria   . Thyroid disease     3 thyroid nodules--seeing Dr. Tonette Lederer in [place]    Past Surgical History  Procedure Laterality Date  . Leep      --1995    Current Outpatient Prescriptions  Medication Sig Dispense Refill  . Ascorbic Acid (VITAMIN C PO) Take 1 tablet by mouth daily.     Marland Kitchen BIOTIN PO Take by mouth daily.    . Cholecalciferol (VITAMIN D3) 5000 UNITS CAPS Take 1 capsule by mouth every other day.    . Ferrous Sulfate (IRON) 325 (65 FE) MG TABS Take by mouth every 30 (thirty) days.     . Multiple Vitamin (MULTI-VITAMIN DAILY PO) Take by mouth once a week.     . NONFORMULARY OR COMPOUNDED ITEM 2.5 mLs daily. 10% (100 mg/ml) Progesterone Cream in HRT Base.  Dispense 30 grams for 30 day supply.    Marland Kitchen PRESCRIPTION MEDICATION Estriol Versabase 0.1mg /ml Cream .3 syringe full pv 3x/week     No current facility-administered medications for this visit.    Family History  Problem Relation Age of Onset  . Asthma Brother   . Leukemia Mother     ROS:  Pertinent items are noted in HPI.  Otherwise, a comprehensive ROS was negative.  Exam:   LMP 09/05/2011    General appearance: alert, cooperative and appears stated age Head: Normocephalic, without obvious abnormality, atraumatic Neck: no adenopathy, supple, symmetrical, trachea midline and thyroid normal to inspection and palpation Lungs: clear to auscultation bilaterally Breasts: normal appearance, no masses or tenderness, Inspection negative, No nipple retraction or dimpling, No nipple discharge or bleeding, No axillary or supraclavicular adenopathy Heart: regular rate and rhythm Abdomen: soft, non-tender; bowel sounds normal; no masses,  no organomegaly Extremities: extremities normal, atraumatic, no cyanosis or edema Skin: Skin color, texture, turgor normal. No rashes or lesions Lymph nodes: Cervical, supraclavicular, and axillary nodes normal. No abnormal inguinal nodes palpated Neurologic: Grossly normal  Pelvic: External genitalia:  no lesions              Urethra:  normal appearing urethra with no masses, tenderness or lesions              Bartholins and Skenes: normal                 Vagina: normal appearing vagina with normal color and discharge, no lesions              Cervix: no lesions              Pap taken: Yes.   Bimanual Exam:  Uterus:  slightly enlarged irregular texture of uterus consistent with probable small fibroids.              Adnexa: normal adnexa and no mass, fullness, tenderness              Rectovaginal: Yes.  .  Confirms.              Anus:  normal sphincter tone, no lesions  Chaperone was present for exam.  Assessment:   Well woman visit with normal exam. Hx  LEEP in 1995.  ASCUS paps and negative HR HPV. Enlarged uterus consistent with probable small fibroids discussed with patient.  HRT may be stimulating these. On HRT.  Plan: Yearly mammogram recommended after age 54. I told patient I recommend this yearly, especially due to her taking HRT.  Recommended self breast exam.  Pap and HR HPV as above. Discussed Calcium, Vitamin D, regular exercise program including cardiovascular and weight bearing exercise. Labs performed.  Yes.  .   Hgb 11.5 Refills given on medications.  No..    Return for pelvic ultrasound.  Order placed.  Follow up annually and prn.     After visit summary provided.

## 2015-03-10 LAB — IPS PAP TEST WITH HPV

## 2015-03-11 LAB — HEMOGLOBIN, FINGERSTICK: HEMOGLOBIN, FINGERSTICK: 11.5 g/dL — AB (ref 12.0–16.0)

## 2015-03-19 ENCOUNTER — Ambulatory Visit (INDEPENDENT_AMBULATORY_CARE_PROVIDER_SITE_OTHER): Payer: 59

## 2015-03-19 ENCOUNTER — Encounter: Payer: Self-pay | Admitting: Obstetrics and Gynecology

## 2015-03-19 ENCOUNTER — Ambulatory Visit (INDEPENDENT_AMBULATORY_CARE_PROVIDER_SITE_OTHER): Payer: 59 | Admitting: Obstetrics and Gynecology

## 2015-03-19 VITALS — BP 102/60 | HR 80 | Ht 67.0 in | Wt 125.0 lb

## 2015-03-19 DIAGNOSIS — N852 Hypertrophy of uterus: Secondary | ICD-10-CM

## 2015-03-19 DIAGNOSIS — Z79890 Hormone replacement therapy (postmenopausal): Secondary | ICD-10-CM

## 2015-03-19 NOTE — Progress Notes (Signed)
Subjective  54 y.o. [id number]  Caucasian female here for pelvic ultrasound for enlarged uterus on pelvic exam.  Patient is on bioidentical HRT. Patient is due for another hormone check.  Does this yearly.  Asking for input on this.  Brings in labs today from her prescriber. Sleep is improved on progesterone cream.  Having hair loss.   Has thyroid nodules. Sees Dr. Kalman ShanAugustides in [facility] for this.  She thinks thyroid testing was normal.  Getting confused about HRT.  Seeing a lot of different doctors - two of which do bioidenticals and want to be measuring her levels either with saliva or blood.  Getting expensive.  Objective  Pelvic ultrasound images and report reviewed with patient.  Uterus - no masses, retroverted uterus EMS - 2.85 mm Ovaries - normal. Free fluid - yes, small amount.      Assessment  Enlarged uterus on exam.  Normal pelvic ultrasound.  Retroverted uterus.    HRT.  Plan Reassurance given regarding pelvic anatomy. Extensive discussion regarding HRT, bioidentical versus nonbioidentical HRT, risks and benefits.  I told patient I can prescribe for her HRT - estradiol and Prometrium but that she must be up to date with annual mammogram which would need to be normal.  Handout on menopause.  Follow up prn.  ___25____ minutes face to face time of which over 50% was spent in counseling.   After visit summary to patient.

## 2015-03-23 ENCOUNTER — Telehealth: Payer: Self-pay | Admitting: Obstetrics and Gynecology

## 2015-03-23 NOTE — Telephone Encounter (Signed)
Patient has some questions about her AVS for 03/19/15. She states it is not complete and some medications is incorrect.

## 2015-03-23 NOTE — Telephone Encounter (Signed)
Spoke with patient. Patient has concerns about her primary diagnosis being "enlarged uterus" on her AVS. "When I had the ultrasound we found out that my uterus is a normal size and not enlarged. I just want to make sure this is not considered a diagnosis when it is was normal." Advised this was used as the primary diagnosis as this was the indication for her having the PUS evaluation with our office. Patient would like to know if this diagnosis will continue to be in her chart for each visit. Advised if findings were not indicative of an enlarged uterus it should not. Patient is agreeable. Patient also states that she is not taking Iron 325 mg every 30 days. Is taking Iron 18 mg three times per week. Discontinued Iron 325 mg and added Iron 18 mg po three times a week as a historical med. Patient is agreeable. Advised I will send a message to Dr.Silva and if she has any additional information I will return the call.

## 2015-03-23 NOTE — Telephone Encounter (Signed)
I agree with the information you shared with the patient.  We did need to "enlarged uterus" to link to the reason for asking for the pelvic ultrasound and then her visit.  Her office note documents that her ultrasound was normal.

## 2016-03-10 ENCOUNTER — Ambulatory Visit: Payer: 59 | Admitting: Obstetrics and Gynecology

## 2016-03-17 ENCOUNTER — Ambulatory Visit: Payer: 59 | Admitting: Obstetrics and Gynecology

## 2016-03-22 IMAGING — US US SOFT TISSUE HEAD/NECK
1 series · 14 of 25 positions shown · non-contrast
Comparison: 09/11/2014

CLINICAL DATA: Nodule.

EXAM:
THYROID ULTRASOUND
TECHNIQUE: Ultrasound examination of the thyroid gland and adjacent soft
tissues was performed.

[Series 1: us soft tissue head/neck · 0.06mm/px · 14 of 36 slices shown]
[im 1/36]
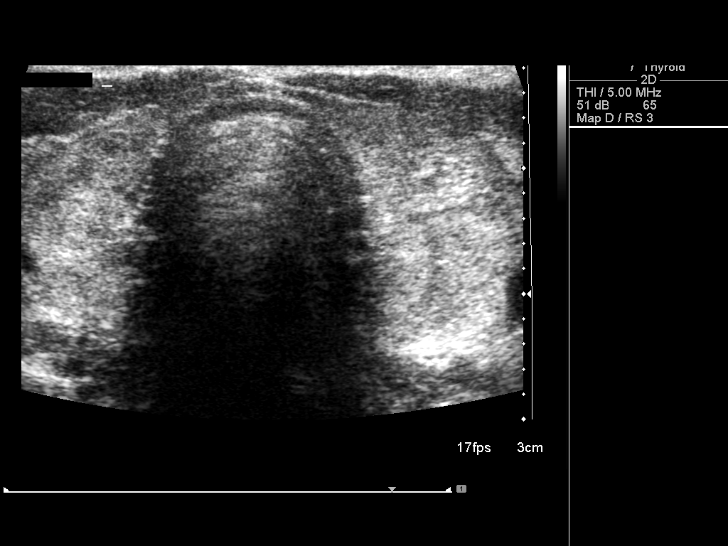
[im 3/36]
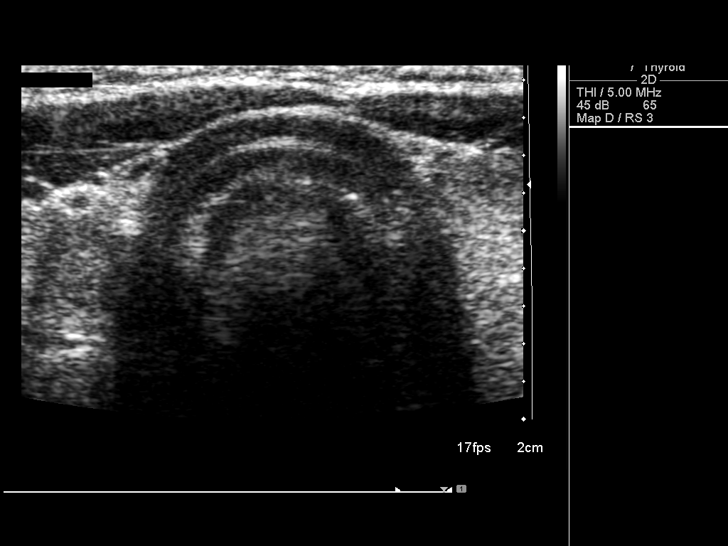
[im 6/36]
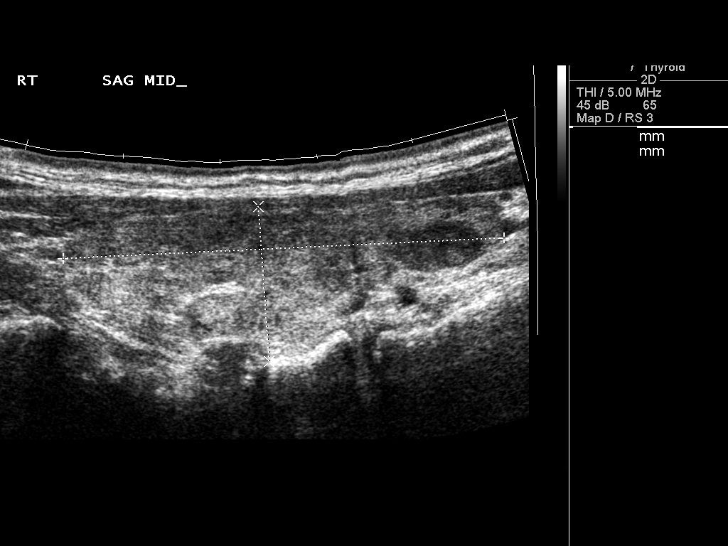
[im 9/36]
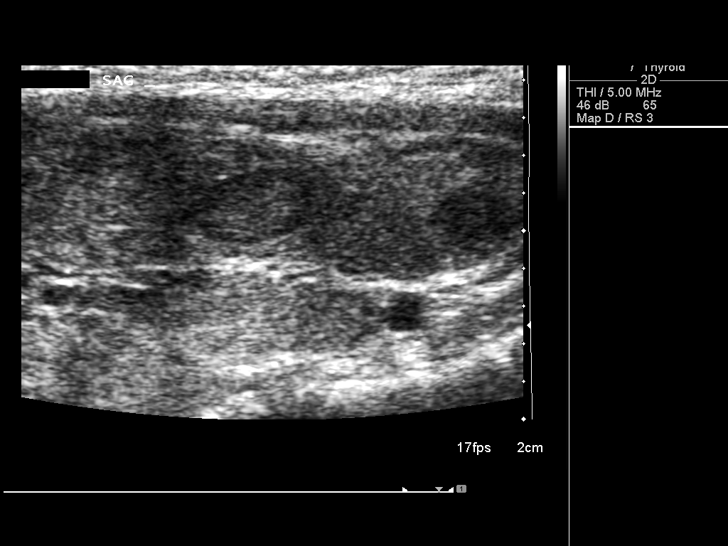
[im 12/36]
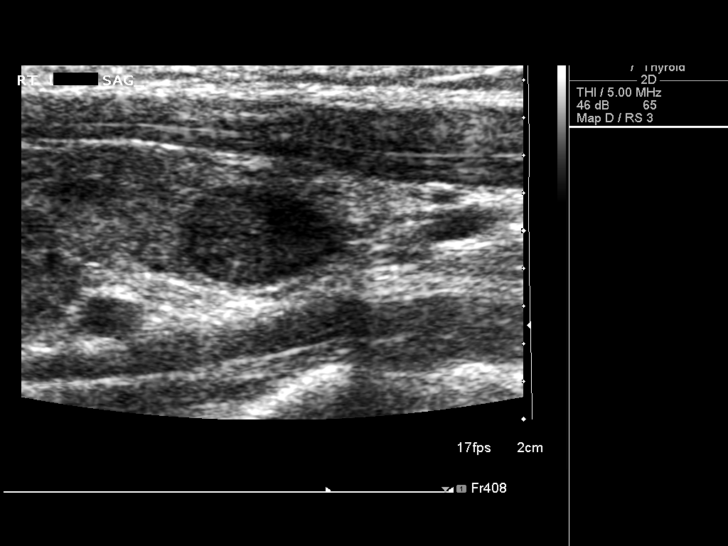
[im 14/36]
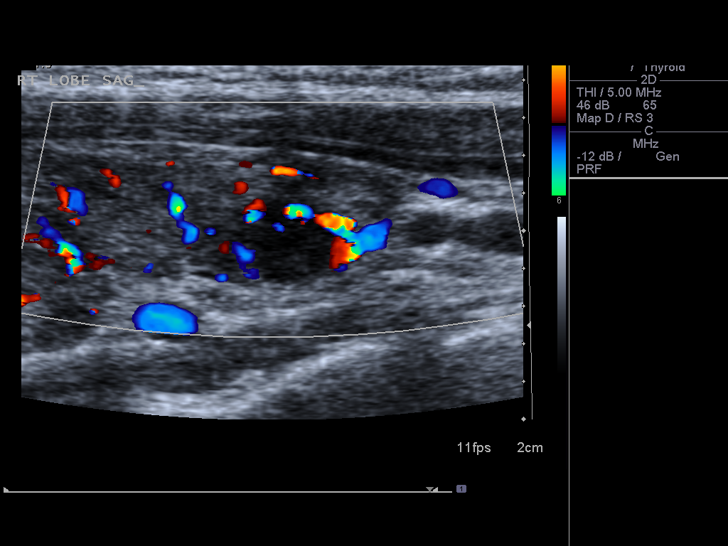
[im 17/36]
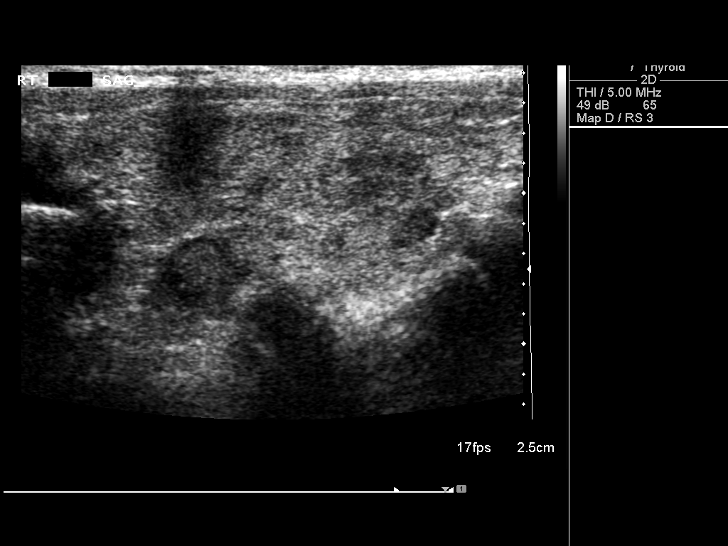
[im 19/36]
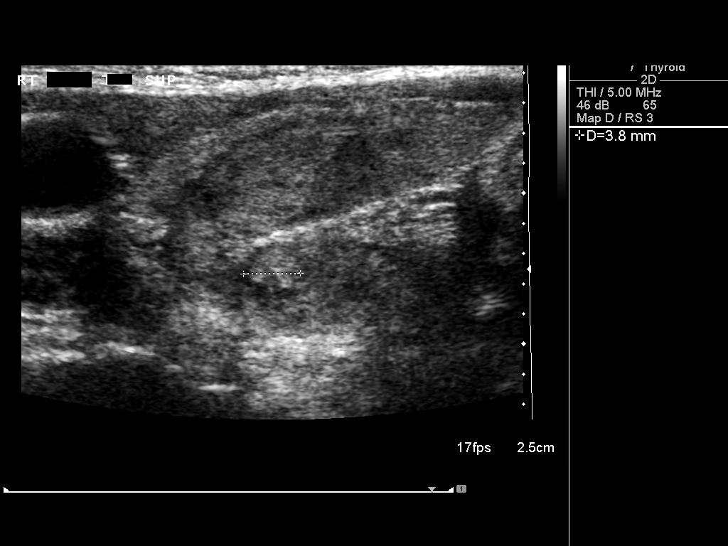
[im 22/36]
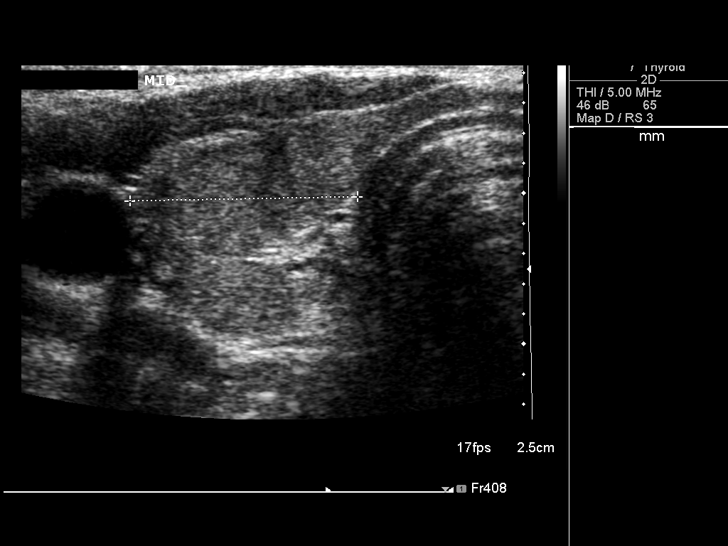
[im 24/36]
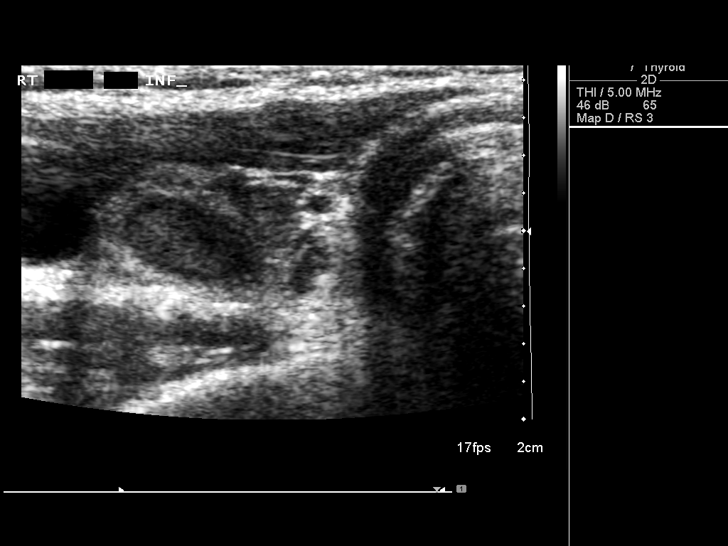
[im 27/36]
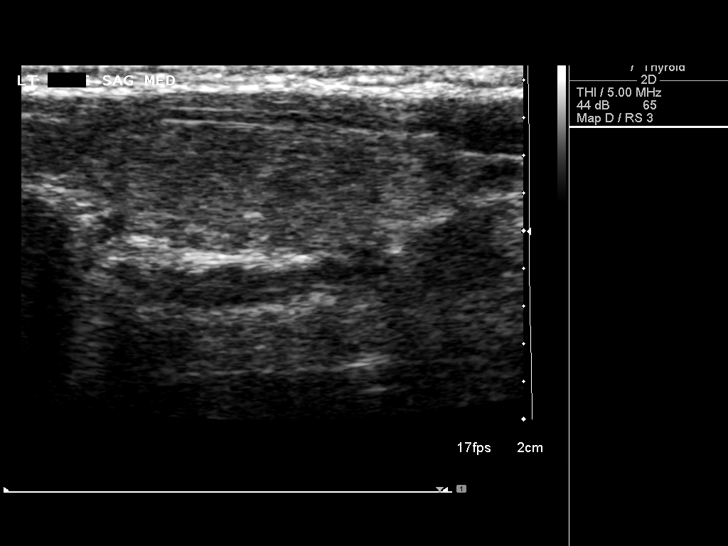
[im 30/36]
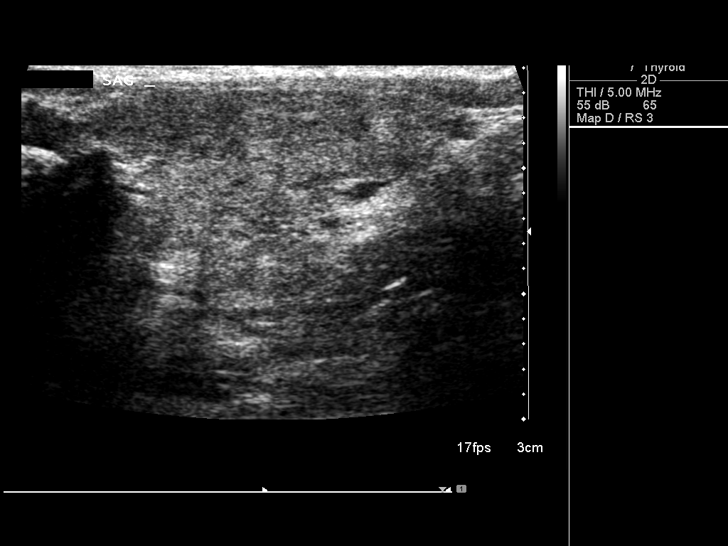
[im 33/36]
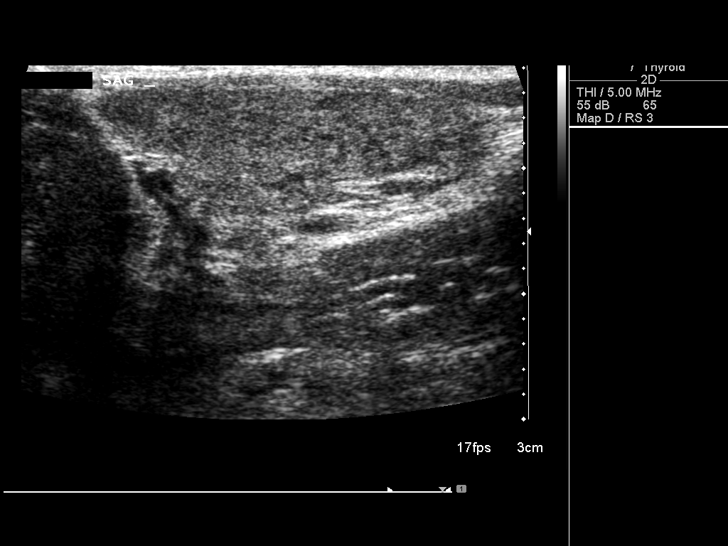
[im 36/36]
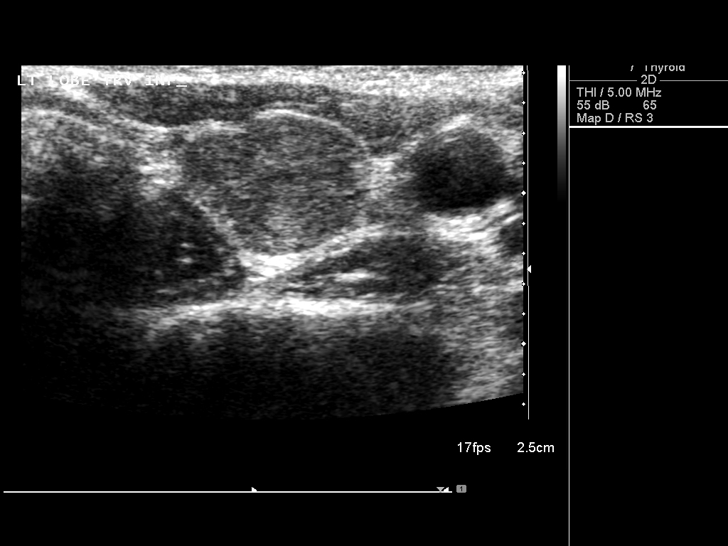

[14 of 25 positions shown; findings below may reference images not displayed]

FINDINGS: Right thyroid lobe

Measurements: 45 x 16 x 15 mm. Somewhat inhomogeneous background
parenchyma. 6 x 4 mm solid nodule, mid lobe. 8 x 5 x 7 mm hypoechoic
solid nodule, inferior pole (previously 8 x 5 x 7). 5 mm hypoechoic
nodule, deep superior pole.

Left thyroid lobe

Measurements: 45 x 16 x 17 mm. Heterogeneous without discrete nodule

Isthmus

Thickness: 2 mm.  No nodules visualized.

Lymphadenopathy

None visualized.
IMPRESSION: 1. Normal-sized thyroid with stable small right nodules. Findings do
not meet current consensus criteria for biopsy. Follow-up by
clinical exam is recommended. If patient has known risk factors for
thyroid carcinoma, consider follow-up ultrasound in 12 months. If
patient is clinically hyperthyroid, consider nuclear medicine
thyroid uptake and scan. This recommendation follows the consensus
statement: Management of Thyroid Nodules Detected as US: Society of
Radiologists in Ultrasound Consensus Conference Statement. Radiology

## 2016-04-13 NOTE — Progress Notes (Signed)
55 y.o. [id number] Married Caucasian female here for annual exam.    Wants some labs:  Vit D and iron level.  Asking what a Hgb is.  Taking vit D 5000 IU and iron 18 mg daily.  She is concerned about the cost of doing lab tests and what is covered by her insurance.   Not interested in mammograms. Had a painful mammogram in past.  Also has concerns about radiation exposure.  Asking about breast density and also breast ultrasound.   Stopped bioidentical HRT after 3 months after she had her last visit.  No real hot flashes.  Notices urinary frequency.  Does not drink soda.  Rare coffee.  Some tea - in general.  Questioning if she has atrophy that is causes this.  Considering topical vaginal estrogen.  Interested in estriol compounded estrogen.   PCP:   Pearson GrippeJames Kim, MD  Patient's last menstrual period was 09/05/2011.           Sexually active: Yes.   female The current method of family planning is post menopausal status.    Exercising: Yes.    weights Smoker:  no  Health Maintenance: Pap:  03-06-15 Neg:Neg HR HPV.  12/12/2013 has ASCUS pap and negative HR HPV testing. History of abnormal Pap:  Yes, Hx LEEP procedure around 1995. MMG:  12-31-2012 Density Cat.C/Neg/BiRads1:[facility].  Has had a painful mammogram in the past which was long lasting. Colonoscopy:  10-23-13 normal per patient  BMD:  09/2013  Result  Normal per patient TDaP:  09/2008 Gardasil:   N/A HIV:  Did with life insurance physical. Hep C:  Did with life insurance physical. Screening Labs:  Urine today: Trace RBCs--asymptomatic.     reports that she has never smoked. She has never used smokeless tobacco. She reports that she drinks about 0.5 oz of alcohol per week . She reports that she uses drugs.  Past Medical History:  Diagnosis Date  . Anemia   . Cough   . Emphysema   . 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 Hematuria   . Raynaud disease   . Thyroid disease    3 thyroid  nodules[place] [facility] [place]  . Vitamin D deficiency     Past Surgical History:  Procedure Laterality Date  . LEEP     --1995    Current Outpatient Prescriptions  Medication Sig Dispense Refill  . Ascorbic Acid (VITAMIN C PO) Take 1 tablet by mouth daily.     Marland Kitchen. BIOTIN PO Take by mouth daily.    . Cholecalciferol (VITAMIN D3) 5000 UNITS CAPS Take 1 capsule by mouth every other day.    . Ferrous Fumarate (IRON) 18 MG TBCR Take 18 mg by mouth 3 (three) times a week.    . Multiple Vitamin (MULTI-VITAMIN DAILY PO) Take 1 tablet by mouth daily.     Marland Kitchen. PRESCRIPTION MEDICATION Estriol Versabase 0.1mg /ml Cream .3 syringe full pv 3x/week    . VITAMIN K PO Take by mouth. Vitamin K7 200mcg daily along magnesium     No current facility-administered medications for this visit.     Family History  Problem Relation Age of Onset  . Asthma Brother   . Leukemia Mother     ROS:  Pertinent items are noted in HPI.  Otherwise, a comprehensive ROS was negative.  Exam:   BP (!) 100/58 (BP Location: Right Arm, Patient Position: Sitting, Cuff Size: Normal)   Pulse 70   Resp  18   Ht 5' 7.5" (1.715 m)   Wt 125 lb 6.4 oz (56.9 kg)   LMP 09/05/2011   BMI 19.35 kg/m     General appearance: alert, cooperative and appears stated age Head: Normocephalic, without obvious abnormality, atraumatic Neck: no adenopathy, supple, symmetrical, trachea midline and thyroid normal to inspection and palpation Lungs: clear to auscultation bilaterally Breasts: normal appearance, no masses or tenderness, No nipple retraction or dimpling, No nipple discharge or bleeding, No axillary or supraclavicular adenopathy Heart: regular rate and rhythm Abdomen: soft, non-tender; no masses, no organomegaly Extremities: extremities normal, atraumatic, no cyanosis or edema Skin: Skin color, texture, turgor normal. No rashes or lesions Lymph nodes: Cervical, supraclavicular, and axillary nodes normal. No abnormal  inguinal nodes palpated Neurologic: Grossly normal  Pelvic: External genitalia:  no lesions              Urethra:  normal appearing urethra with no masses, tenderness or lesions              Bartholins and Skenes: normal                 Vagina: normal appearing vagina with normal color and discharge, no lesions              Cervix: no lesions              Pap taken: No. Bimanual Exam:  Uterus:  normal size, contour, position, consistency, mobility, non-tender              Adnexa: no mass, fullness, tenderness              Rectal exam: Yes.  .  Confirms.              Anus:  normal sphincter tone, no lesions  Chaperone was present for exam.  Assessment:   Well woman visit with normal exam. Remote hx LEEP.  Overdue for mammogram.  Hx HRT use.  Urinary frequency.  Hx anemia.  Hx low vit D level.  Plan: Mammogram discussed.  May choose to do regular digital mammogram and not 3D in order to reduce radiation exposure.  I suggested [facility] as an alternative location for her next mammogram. Recommended self breast exam.  Pap and HR HPV as above. Guidelines for Calcium, Vitamin D, regular exercise program including cardiovascular and weight bearing exercise. Check iron and vit D level only.  She understands that I do not know her individual insurance policy and what is covered versus counting toward her deductible.   She is Ok with these two labs today. Discussed bladder irritants.  We discussed local vaginal estrogen treatments - cream, vagifem, Estring.  She understands she needs to get her mammogram done and be normal in order to prescribe local estrogen.  If she wishes for compounded estrogen, she may need to return to her previous prescriber.  Follow up annually and prn.       After visit summary provided.

## 2016-04-14 ENCOUNTER — Ambulatory Visit (INDEPENDENT_AMBULATORY_CARE_PROVIDER_SITE_OTHER): Payer: 59 | Admitting: Obstetrics and Gynecology

## 2016-04-14 ENCOUNTER — Encounter: Payer: Self-pay | Admitting: Obstetrics and Gynecology

## 2016-04-14 VITALS — BP 100/58 | HR 70 | Resp 18 | Ht 67.5 in | Wt 125.4 lb

## 2016-04-14 DIAGNOSIS — Z0000 Encounter for general adult medical examination without abnormal findings: Secondary | ICD-10-CM

## 2016-04-14 DIAGNOSIS — Z01419 Encounter for gynecological examination (general) (routine) without abnormal findings: Secondary | ICD-10-CM | POA: Diagnosis not present

## 2016-04-14 DIAGNOSIS — Z862 Personal history of diseases of the blood and blood-forming organs and certain disorders involving the immune mechanism: Secondary | ICD-10-CM | POA: Diagnosis not present

## 2016-04-14 LAB — POCT URINALYSIS DIPSTICK
Bilirubin, UA: NEGATIVE
Glucose, UA: NEGATIVE
Ketones, UA: NEGATIVE
Leukocytes, UA: NEGATIVE
Nitrite, UA: NEGATIVE
PH UA: 5
PROTEIN UA: NEGATIVE
UROBILINOGEN UA: NEGATIVE

## 2016-04-14 LAB — IRON: Iron: 52 ug/dL (ref 45–160)

## 2016-04-14 NOTE — Patient Instructions (Signed)

## 2016-04-15 ENCOUNTER — Telehealth: Payer: Self-pay

## 2016-04-15 LAB — VITAMIN D 25 HYDROXY (VIT D DEFICIENCY, FRACTURES): VIT D 25 HYDROXY: 49 ng/mL (ref 30–100)

## 2016-04-15 NOTE — Telephone Encounter (Signed)
Spoke with patient. Advised of results and message as seen below from Dr.Silva. Patient is agreeable and verbalizes understanding.  Routing to provider for final review. Patient agreeable to disposition. Will close encounter   

## 2016-04-15 NOTE — Telephone Encounter (Signed)
-----   Message from Patton SallesBrook E Amundson C Silva, MD sent at 04/15/2016  6:45 AM EST ----- Please report normal vitamin D and normal iron level.  She may continue on her current dosing of vitamin D and iron as these are keeping her at a normal level for each of them.   Nothing further was tested as per the patient request.  Cc- Claudette LawsAmanda Dixon

## 2016-08-09 ENCOUNTER — Ambulatory Visit (INDEPENDENT_AMBULATORY_CARE_PROVIDER_SITE_OTHER): Payer: 59 | Admitting: Podiatry

## 2016-08-09 ENCOUNTER — Encounter: Payer: Self-pay | Admitting: Podiatry

## 2016-08-09 VITALS — BP 107/65 | HR 70 | Resp 16 | Ht 67.0 in | Wt 125.0 lb

## 2016-08-09 DIAGNOSIS — B351 Tinea unguium: Secondary | ICD-10-CM | POA: Diagnosis not present

## 2016-08-09 NOTE — Progress Notes (Signed)
   Subjective:    Patient ID: Natasha Stafford, female    DOB: 1960-06-27, 56 y.o.   MRN: [id number]  HPI Chief Complaint  Patient presents with  . Nail Problem    Bilateral; great toe; nail discoloration; pt stated, "Saw white on nail and cut it off"; x2 months      Review of Systems  All other systems reviewed and are negative.      Objective:   Physical Exam        Assessment & Plan:

## 2016-08-22 NOTE — Progress Notes (Signed)
Subjective:     Patient ID: Natasha Stafford, female   DOB: 05-28-1960, 56 y.o.   MRN: [id number]  HPI patient presents stating the nails have been discolored and I was concerned about what this may be   Review of Systems  All other systems reviewed and are negative.      Objective:   Physical Exam  Constitutional: She is oriented to person, place, and time.  Cardiovascular: Intact distal pulses.   Musculoskeletal: Normal range of motion.  Neurological: She is oriented to person, place, and time.  Skin: Skin is warm and dry.  Nursing note and vitals reviewed.  neurovascular status intact muscle strength adequate range of motion within normal limits with patient found have discolored hallux nails bilateral distal one half the nailbed that's localized with no proximal changes or involvement of other nails. 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 Start with topical we will see results

## 2017-01-06 ENCOUNTER — Other Ambulatory Visit: Payer: Self-pay | Admitting: Internal Medicine

## 2017-01-06 DIAGNOSIS — E041 Nontoxic single thyroid nodule: Secondary | ICD-10-CM

## 2017-01-19 ENCOUNTER — Other Ambulatory Visit: Payer: 59

## 2017-02-08 ENCOUNTER — Other Ambulatory Visit: Payer: Self-pay

## 2017-02-21 ENCOUNTER — Other Ambulatory Visit: Payer: Self-pay

## 2017-03-14 ENCOUNTER — Other Ambulatory Visit: Payer: Self-pay

## 2017-04-19 ENCOUNTER — Ambulatory Visit: Payer: 59 | Admitting: Obstetrics and Gynecology

## 2017-05-11 ENCOUNTER — Other Ambulatory Visit: Payer: Self-pay

## 2017-05-24 ENCOUNTER — Ambulatory Visit
Admission: RE | Admit: 2017-05-24 | Discharge: 2017-05-24 | Disposition: A | Discharge status: Home / Self Care | Payer: 59 | Source: Ambulatory Visit | Attending: Internal Medicine | Admitting: Internal Medicine

## 2017-05-24 DIAGNOSIS — E041 Nontoxic single thyroid nodule: Secondary | ICD-10-CM

## 2019-12-05 ENCOUNTER — Other Ambulatory Visit: Payer: Self-pay | Admitting: Internal Medicine

## 2019-12-05 DIAGNOSIS — E041 Nontoxic single thyroid nodule: Secondary | ICD-10-CM

## 2019-12-09 ENCOUNTER — Ambulatory Visit
Admission: RE | Admit: 2019-12-09 | Discharge: 2019-12-09 | Disposition: A | Discharge status: Home / Self Care | Payer: No Typology Code available for payment source | Source: Ambulatory Visit | Attending: Internal Medicine | Admitting: Internal Medicine

## 2019-12-09 DIAGNOSIS — E041 Nontoxic single thyroid nodule: Secondary | ICD-10-CM

## 2020-01-21 NOTE — Progress Notes (Signed)
Patient referred by Jani Gravel, MD for leg edema  Subjective:   Natasha Stafford, female    DOB: 1961-04-05, 59 y.o.   MRN: [id number]   Chief Complaint  Patient presents with  . Edema  . New Patient (Initial Visit)     HPI  59 y.o. Caucasian female with leg swelling.  She notices R>L leg swelling for a long time. She denies chest pain, shortness of breath, palpitations, leg edema, orthopnea, PND, TIA/syncope. She has multiple questions about health supplements. She is not vaccinated for COVID, given her h/o Hashimoto thyroiditis from 2016 This has been stable and she is not on any medications for the same.   Past Medical History:  Diagnosis Date  . Anemia   . Cough   . Emphysema   . 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 Hematuria   . Raynaud disease   . Thyroid disease    3 thyroid nodules--seeing Dr. Glenna Durand in [place]  . Vitamin D deficiency      Past Surgical History:  Procedure Laterality Date  . LEEP     --1995     Social History   Tobacco Use  Smoking Status Never Smoker  Smokeless Tobacco Never Used    Social History   Substance and Sexual Activity  Alcohol Use Yes  . Alcohol/week: 1.0 standard drink  . Types: 1 Standard drinks or equivalent per week   Comment: occ beer     Family History  Problem Relation Age of Onset  . Asthma Brother   . Leukemia Mother      Current Outpatient Medications on File Prior to Visit  Medication Sig Dispense Refill  . Ascorbic Acid (VITAMIN C PO) Take 1 tablet by mouth daily.     Marland Kitchen BIOTIN PO Take by mouth daily.    . Calcium-Magnesium-Vitamin D (CAL-MAG COMPLEX) 300-150-400 MG-MG-UNIT TABS Take by mouth.    . Cholecalciferol (VITAMIN D3) 5000 UNITS CAPS Take 1 capsule by mouth every other day.    . Ferrous Fumarate (IRON) 18 MG TBCR Take 18 mg by mouth 3 (three) times a week.    . Multiple Vitamin (MULTI-VITAMIN DAILY PO) Take 1 tablet by mouth daily.     Marland Kitchen PRESCRIPTION MEDICATION  Estriol Versabase 0.58m/ml Cream .3 syringe full pv 3x/week    . VITAMIN K PO Take by mouth. Vitamin K7 2067m daily along magnesium     No current facility-administered medications on file prior to visit.    Cardiovascular and other pertinent studies:  EKG 01/22/2020: Sinus rhythm 79 bpm Normal EKG   Recent labs: 12/04/2019: Glucose 96, BUN/Cr 16/0.72. EGFR 92. Na/K 142/4.5.  H/H 11.7/36.2. MCV 92.1. Platelets 207 Chol 249, TG 53, HDL 83, LDL 158 TSH 2.880 normal    Review of Systems  Cardiovascular: Positive for leg swelling. Negative for chest pain, dyspnea on exertion, palpitations and syncope.         Vitals:   01/22/20 1519  BP: 114/67  Pulse: 81  SpO2: 98%     Body mass index is 19.73 kg/m. Filed Weights   01/22/20 1519  Weight: 126 lb (57.2 kg)     Objective:   Physical Exam Vitals and nursing note reviewed.  Constitutional:      General: She is not in acute distress. Neck:     Vascular: No JVD.  Cardiovascular:     Rate and Rhythm: Normal rate and regular rhythm.     Heart sounds: Normal  heart sounds. No murmur heard.   Pulmonary:     Effort: Pulmonary effort is normal.     Breath sounds: Normal breath sounds. No wheezing or rales.  Musculoskeletal:     Right lower leg: Edema (Trace) present.          Assessment & Recommendations:   59 y.o. Caucasian female with leg swelling.  Leg swelling; Very low suspicion for cardiac etiology. I will obtain venous duplex US to rule out DVT.  Hyperlipidemia: I recommended considering statin. She does not want to start statin. I recommended calcium score scan for risk stratification. She is not interested, fears radiation.   COVID education: I discussed with the patient regarding the risks of Covid infection, hospitalization, and death.  I strongly recommended getting vaccinated against Covid as a safe and FDA approved measure to reduce risk of infection, hospitalization, as well as  death.      Thank you for referring the patient to [place]. Please feel free to contact with any questions.   Nigel Mormon, MD Pager: [phone] Office: [phone]

## 2020-01-22 ENCOUNTER — Other Ambulatory Visit: Payer: Self-pay

## 2020-01-22 ENCOUNTER — Ambulatory Visit: Payer: No Typology Code available for payment source | Admitting: Cardiology

## 2020-01-22 ENCOUNTER — Encounter: Payer: Self-pay | Admitting: Cardiology

## 2020-01-22 VITALS — BP 114/67 | HR 81 | Ht 67.0 in | Wt 126.0 lb

## 2020-01-22 DIAGNOSIS — Z7189 Other specified counseling: Secondary | ICD-10-CM

## 2020-01-22 DIAGNOSIS — R600 Localized edema: Secondary | ICD-10-CM

## 2020-01-22 DIAGNOSIS — E782 Mixed hyperlipidemia: Secondary | ICD-10-CM

## 2020-01-23 ENCOUNTER — Encounter: Payer: Self-pay | Admitting: Cardiology

## 2020-01-23 DIAGNOSIS — R600 Localized edema: Secondary | ICD-10-CM | POA: Insufficient documentation

## 2020-01-23 DIAGNOSIS — E782 Mixed hyperlipidemia: Secondary | ICD-10-CM | POA: Insufficient documentation

## 2020-01-23 DIAGNOSIS — Z7189 Other specified counseling: Secondary | ICD-10-CM | POA: Insufficient documentation

## 2020-01-24 ENCOUNTER — Telehealth: Payer: Self-pay

## 2020-01-24 ENCOUNTER — Other Ambulatory Visit: Payer: Self-pay | Admitting: Cardiology

## 2020-01-24 DIAGNOSIS — R600 Localized edema: Secondary | ICD-10-CM

## 2020-01-24 NOTE — Telephone Encounter (Signed)
lmom to sch lower venous US per MP.

## 2020-01-30 ENCOUNTER — Ambulatory Visit: Payer: No Typology Code available for payment source

## 2020-01-30 ENCOUNTER — Other Ambulatory Visit: Payer: Self-pay

## 2020-01-30 DIAGNOSIS — R600 Localized edema: Secondary | ICD-10-CM

## 2020-01-31 ENCOUNTER — Telehealth: Payer: Self-pay

## 2020-01-31 NOTE — Telephone Encounter (Signed)
Patient is aware of results, but is now asking if anything else was found because the top of her foot is swelling. If nothing else was found, who should she see about the swelling.

## 2020-01-31 NOTE — Telephone Encounter (Signed)
Unable to reach patient. Left vm to cb.

## 2020-01-31 NOTE — Telephone Encounter (Signed)
PCP?

## 2020-01-31 NOTE — Telephone Encounter (Signed)
-----   Message from [facility], MD sent at 01/31/2020  9:06 AM EDT ----- No clot in leg veins.   Thanks MJP

## 2020-01-31 NOTE — Telephone Encounter (Signed)
Patient is aware 

## 2021-01-31 IMAGING — US US THYROID
1 series · 13 of 25 positions shown · non-contrast
Comparison: 05/24/2017

CLINICAL DATA: 59-year-old female with thyroid nodules

EXAM:
THYROID ULTRASOUND
TECHNIQUE: Ultrasound examination of the thyroid gland and adjacent soft
tissues was performed.

[Series 1: us thyroid · 0.07mm/px · 58 acquisitions, 13 frames shown]
[im 1/58]
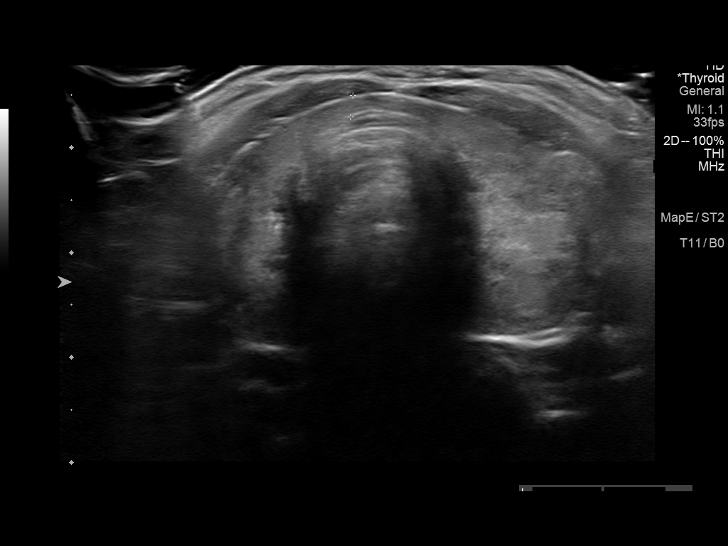
[im 5/58]
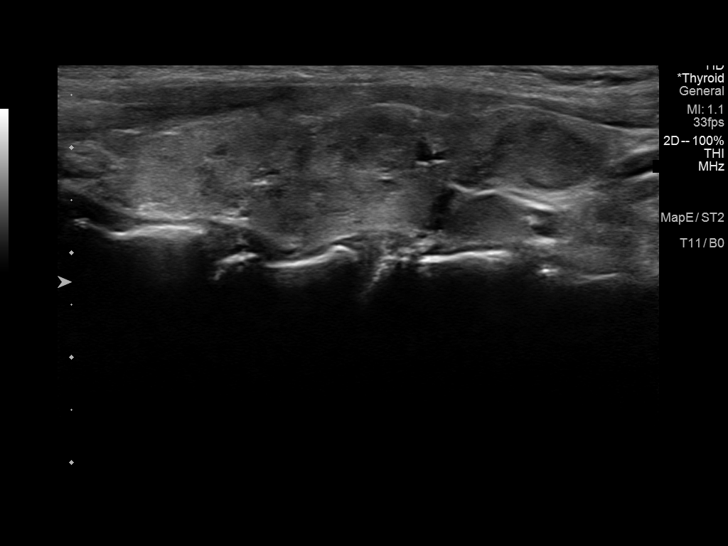
[im 10/58]
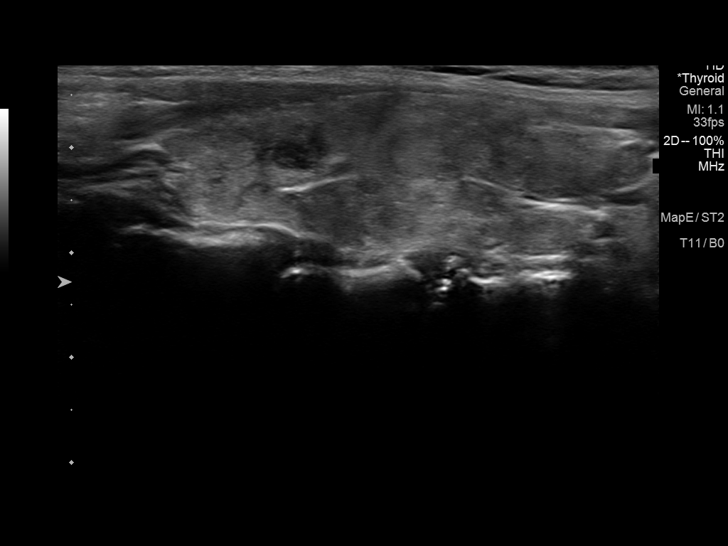
[im 15/58]
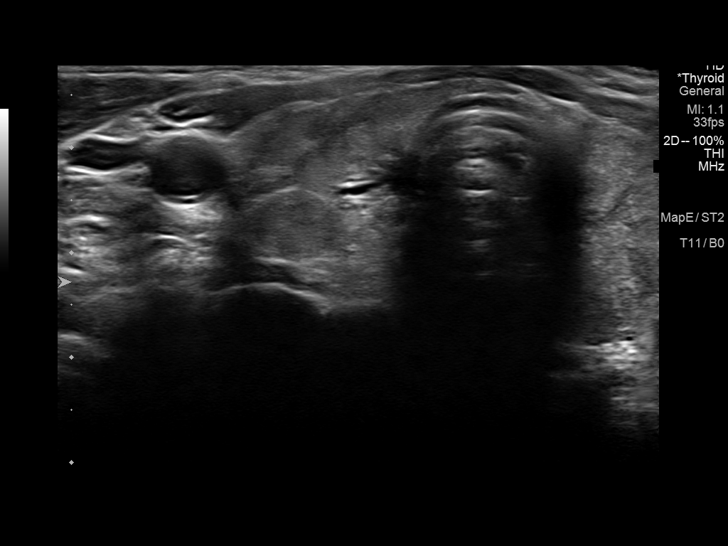
[im 20/58]
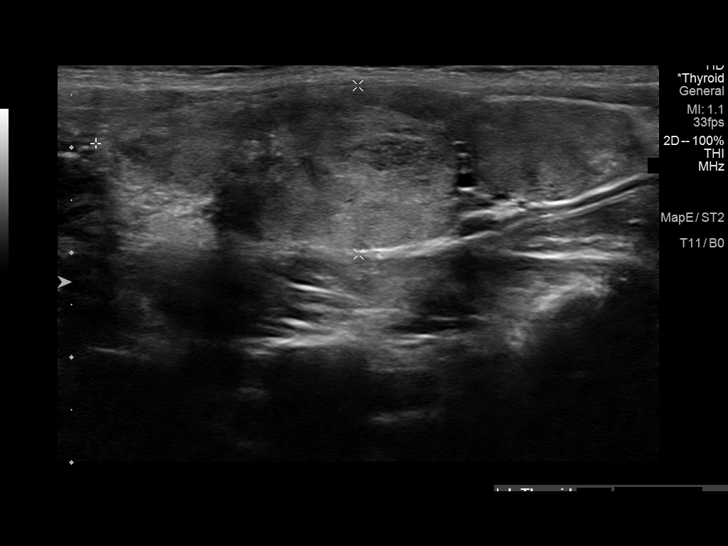
[im 24/58]
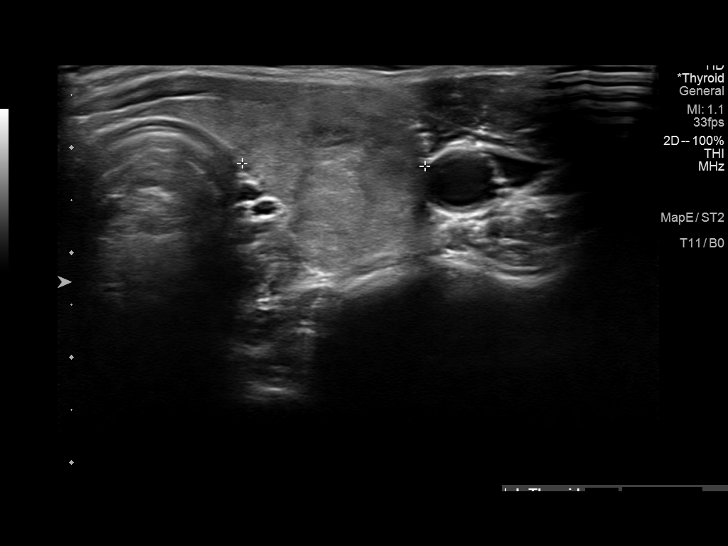
[im 29/58]
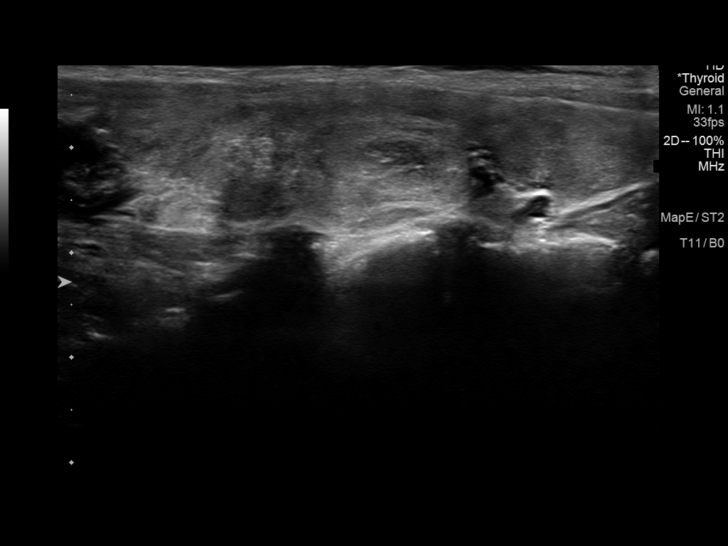
[im 34/58]
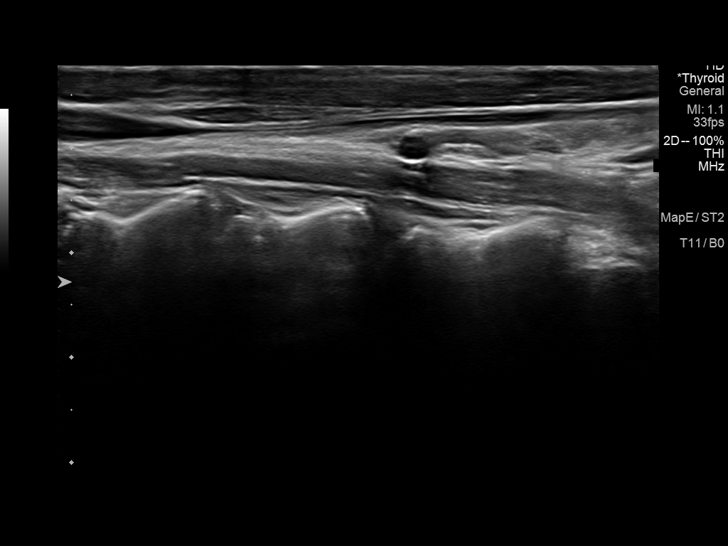
[im 39/58]
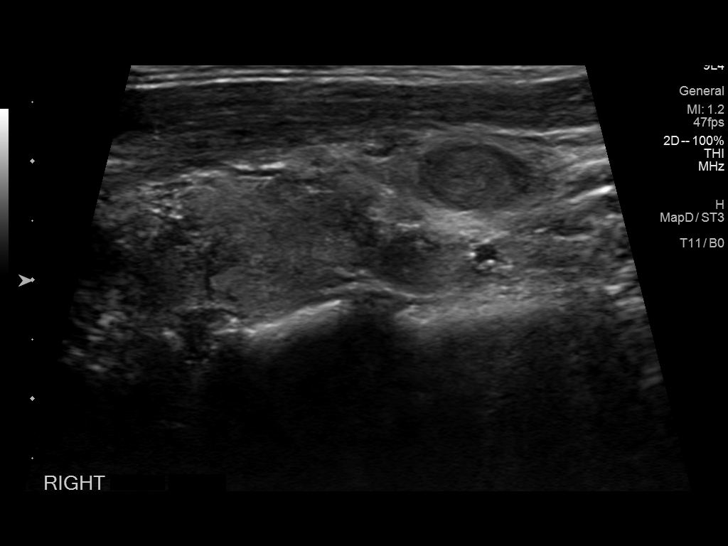
[im 43/58]
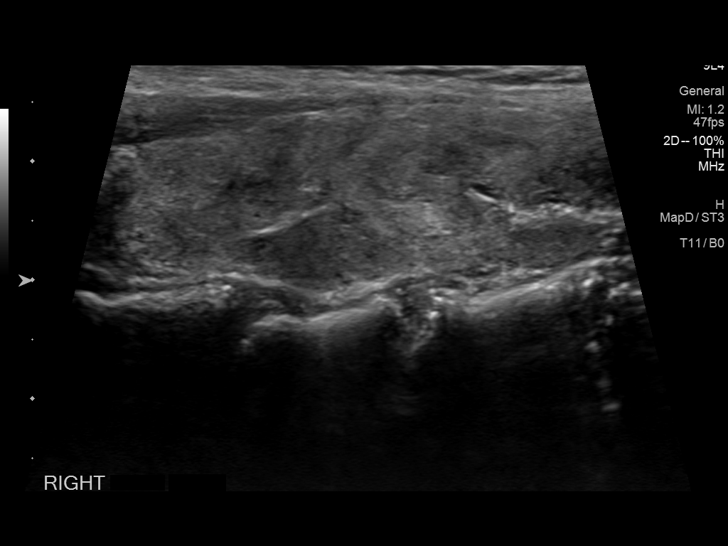
[im 48/58]
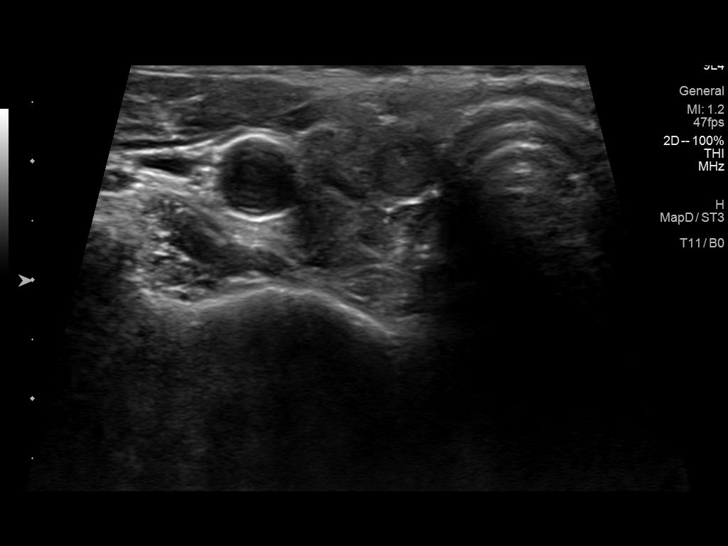
[im 53/58]
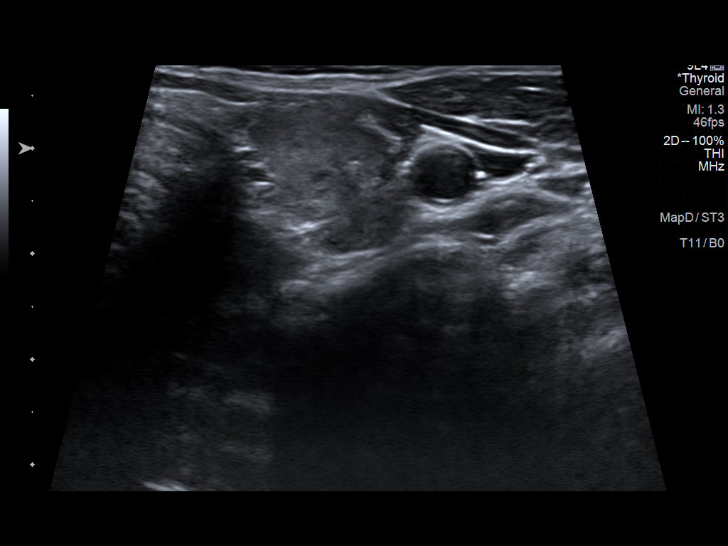
[im 58/58]
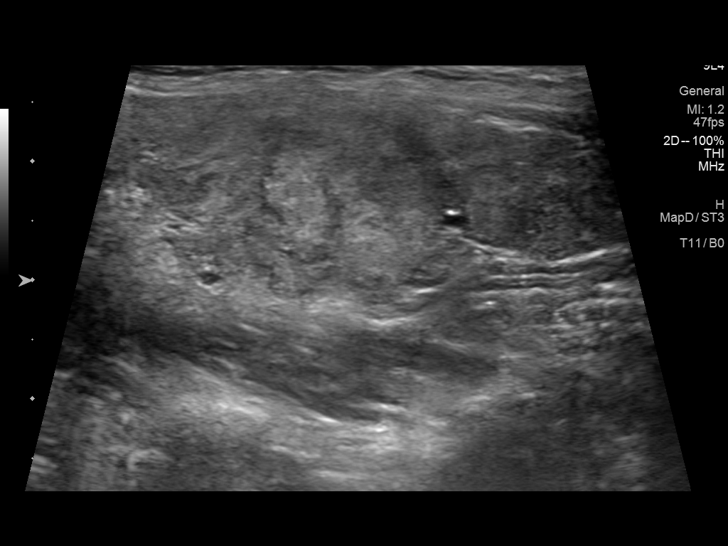

[13 of 25 positions shown; findings below may reference images not displayed]

FINDINGS: Parenchymal Echotexture: Moderately heterogenous

Isthmus: 0.2 cm

Right lobe: 5.2 cm x 1.5 cm x 1.3 cm

Left lobe: 5.3 cm x 1.6 cm x 1.7 cm

_________________________________________________________

Estimated total number of nodules >/= 1 cm: 0

Number of spongiform nodules >/=  2 cm not described below (TR1): 0

Number of mixed cystic and solid nodules >/= 1.5 cm not described
below (TR2): 0

_________________________________________________________

Right mid thyroid nodule, labeled 1, 6 mm, unchanged in size and
does not meet criteria for surveillance or biopsy.

Right lower thyroid nodule, 9 mm, unchanged in size, and does not
meet criteria for further surveillance or biopsy.

No adenopathy
IMPRESSION: Heterogeneous thyroid suggesting medical thyroid disease.

No thyroid nodule meets criteria for biopsy or surveillance, as
designated by the newly established ACR TI-RADS criteria.

Recommendations follow those established by the new ACR TI-RADS
criteria ([HOSPITAL] 0389;[DATE]).

## 2022-10-10 ENCOUNTER — Other Ambulatory Visit (HOSPITAL_COMMUNITY): Payer: Self-pay | Admitting: Obstetrics and Gynecology

## 2022-10-10 DIAGNOSIS — Z136 Encounter for screening for cardiovascular disorders: Secondary | ICD-10-CM
